# Patient Record
Sex: Female | Born: 1945 | Race: White | Hispanic: No | Smoking: Never smoker
Health system: Southern US, Community
[De-identification: ages and names within clinical notes are randomized; demographics above are authoritative.]

## PROBLEM LIST (undated history)

## (undated) DIAGNOSIS — F419 Anxiety disorder, unspecified: Secondary | ICD-10-CM

## (undated) DIAGNOSIS — R531 Weakness: Secondary | ICD-10-CM

## (undated) DIAGNOSIS — A770 Spotted fever due to Rickettsia rickettsii: Secondary | ICD-10-CM

## (undated) DIAGNOSIS — I509 Heart failure, unspecified: Secondary | ICD-10-CM

## (undated) DIAGNOSIS — I499 Cardiac arrhythmia, unspecified: Secondary | ICD-10-CM

## (undated) DIAGNOSIS — E559 Vitamin D deficiency, unspecified: Secondary | ICD-10-CM

## (undated) DIAGNOSIS — C4442 Squamous cell carcinoma of skin of scalp and neck: Secondary | ICD-10-CM

## (undated) HISTORY — DX: Anxiety disorder, unspecified: F41.9

## (undated) HISTORY — PX: ABDOMINAL HYSTERECTOMY: SHX81

## (undated) HISTORY — DX: Spotted fever due to Rickettsia rickettsii: A77.0

## (undated) HISTORY — DX: Weakness: R53.1

## (undated) HISTORY — DX: Squamous cell carcinoma of skin of scalp and neck: C44.42

## (undated) HISTORY — DX: Vitamin D deficiency, unspecified: E55.9

---

## 2016-04-03 DIAGNOSIS — Z0000 Encounter for general adult medical examination without abnormal findings: Secondary | ICD-10-CM | POA: Diagnosis not present

## 2016-04-03 DIAGNOSIS — Z6828 Body mass index (BMI) 28.0-28.9, adult: Secondary | ICD-10-CM | POA: Diagnosis not present

## 2016-08-13 DIAGNOSIS — Z23 Encounter for immunization: Secondary | ICD-10-CM | POA: Diagnosis not present

## 2016-10-02 DIAGNOSIS — F411 Generalized anxiety disorder: Secondary | ICD-10-CM | POA: Diagnosis not present

## 2016-10-02 DIAGNOSIS — J060 Acute laryngopharyngitis: Secondary | ICD-10-CM | POA: Diagnosis not present

## 2016-10-02 DIAGNOSIS — I10 Essential (primary) hypertension: Secondary | ICD-10-CM | POA: Diagnosis not present

## 2017-04-02 DIAGNOSIS — N6322 Unspecified lump in the left breast, upper inner quadrant: Secondary | ICD-10-CM | POA: Diagnosis not present

## 2017-04-02 DIAGNOSIS — Z0001 Encounter for general adult medical examination with abnormal findings: Secondary | ICD-10-CM | POA: Diagnosis not present

## 2017-04-02 DIAGNOSIS — Z6826 Body mass index (BMI) 26.0-26.9, adult: Secondary | ICD-10-CM | POA: Diagnosis not present

## 2017-04-02 DIAGNOSIS — R5383 Other fatigue: Secondary | ICD-10-CM | POA: Diagnosis not present

## 2017-04-02 DIAGNOSIS — I10 Essential (primary) hypertension: Secondary | ICD-10-CM | POA: Diagnosis not present

## 2017-04-02 DIAGNOSIS — E782 Mixed hyperlipidemia: Secondary | ICD-10-CM | POA: Diagnosis not present

## 2017-04-16 DIAGNOSIS — N6320 Unspecified lump in the left breast, unspecified quadrant: Secondary | ICD-10-CM | POA: Diagnosis not present

## 2017-04-16 DIAGNOSIS — N641 Fat necrosis of breast: Secondary | ICD-10-CM | POA: Diagnosis not present

## 2017-04-16 DIAGNOSIS — R922 Inconclusive mammogram: Secondary | ICD-10-CM | POA: Diagnosis not present

## 2017-04-16 DIAGNOSIS — N6489 Other specified disorders of breast: Secondary | ICD-10-CM | POA: Diagnosis not present

## 2017-07-14 DIAGNOSIS — Z23 Encounter for immunization: Secondary | ICD-10-CM | POA: Diagnosis not present

## 2017-09-15 DIAGNOSIS — J060 Acute laryngopharyngitis: Secondary | ICD-10-CM | POA: Diagnosis not present

## 2017-10-02 DIAGNOSIS — R600 Localized edema: Secondary | ICD-10-CM | POA: Diagnosis not present

## 2017-10-02 DIAGNOSIS — I10 Essential (primary) hypertension: Secondary | ICD-10-CM | POA: Diagnosis not present

## 2017-10-02 DIAGNOSIS — E559 Vitamin D deficiency, unspecified: Secondary | ICD-10-CM | POA: Diagnosis not present

## 2017-10-02 DIAGNOSIS — E782 Mixed hyperlipidemia: Secondary | ICD-10-CM | POA: Diagnosis not present

## 2017-10-02 DIAGNOSIS — F411 Generalized anxiety disorder: Secondary | ICD-10-CM | POA: Diagnosis not present

## 2017-10-12 DIAGNOSIS — J302 Other seasonal allergic rhinitis: Secondary | ICD-10-CM | POA: Diagnosis not present

## 2017-10-12 DIAGNOSIS — J209 Acute bronchitis, unspecified: Secondary | ICD-10-CM | POA: Diagnosis not present

## 2017-10-12 DIAGNOSIS — J0100 Acute maxillary sinusitis, unspecified: Secondary | ICD-10-CM | POA: Diagnosis not present

## 2017-12-02 DIAGNOSIS — J060 Acute laryngopharyngitis: Secondary | ICD-10-CM | POA: Diagnosis not present

## 2017-12-31 DIAGNOSIS — M65879 Other synovitis and tenosynovitis, unspecified ankle and foot: Secondary | ICD-10-CM | POA: Diagnosis not present

## 2017-12-31 DIAGNOSIS — M79671 Pain in right foot: Secondary | ICD-10-CM | POA: Diagnosis not present

## 2018-02-19 DIAGNOSIS — L811 Chloasma: Secondary | ICD-10-CM | POA: Diagnosis not present

## 2018-02-19 DIAGNOSIS — L814 Other melanin hyperpigmentation: Secondary | ICD-10-CM | POA: Diagnosis not present

## 2018-02-19 DIAGNOSIS — L570 Actinic keratosis: Secondary | ICD-10-CM | POA: Diagnosis not present

## 2018-02-19 DIAGNOSIS — L820 Inflamed seborrheic keratosis: Secondary | ICD-10-CM | POA: Diagnosis not present

## 2018-02-19 DIAGNOSIS — D225 Melanocytic nevi of trunk: Secondary | ICD-10-CM | POA: Diagnosis not present

## 2018-04-07 DIAGNOSIS — Z0000 Encounter for general adult medical examination without abnormal findings: Secondary | ICD-10-CM | POA: Diagnosis not present

## 2018-04-07 DIAGNOSIS — R5383 Other fatigue: Secondary | ICD-10-CM | POA: Diagnosis not present

## 2018-04-07 DIAGNOSIS — E559 Vitamin D deficiency, unspecified: Secondary | ICD-10-CM | POA: Diagnosis not present

## 2018-04-07 DIAGNOSIS — Z1231 Encounter for screening mammogram for malignant neoplasm of breast: Secondary | ICD-10-CM | POA: Diagnosis not present

## 2018-04-07 DIAGNOSIS — Z6823 Body mass index (BMI) 23.0-23.9, adult: Secondary | ICD-10-CM | POA: Diagnosis not present

## 2018-04-07 DIAGNOSIS — E782 Mixed hyperlipidemia: Secondary | ICD-10-CM | POA: Diagnosis not present

## 2018-04-07 DIAGNOSIS — Z13820 Encounter for screening for osteoporosis: Secondary | ICD-10-CM | POA: Diagnosis not present

## 2018-05-13 DIAGNOSIS — M8589 Other specified disorders of bone density and structure, multiple sites: Secondary | ICD-10-CM | POA: Diagnosis not present

## 2018-05-13 DIAGNOSIS — Z1231 Encounter for screening mammogram for malignant neoplasm of breast: Secondary | ICD-10-CM | POA: Diagnosis not present

## 2018-05-13 DIAGNOSIS — N959 Unspecified menopausal and perimenopausal disorder: Secondary | ICD-10-CM | POA: Diagnosis not present

## 2018-06-11 DIAGNOSIS — H40013 Open angle with borderline findings, low risk, bilateral: Secondary | ICD-10-CM | POA: Diagnosis not present

## 2018-06-24 DIAGNOSIS — J209 Acute bronchitis, unspecified: Secondary | ICD-10-CM | POA: Diagnosis not present

## 2018-06-24 DIAGNOSIS — J0100 Acute maxillary sinusitis, unspecified: Secondary | ICD-10-CM | POA: Diagnosis not present

## 2018-07-27 DIAGNOSIS — Z23 Encounter for immunization: Secondary | ICD-10-CM | POA: Diagnosis not present

## 2018-08-07 DIAGNOSIS — R05 Cough: Secondary | ICD-10-CM | POA: Diagnosis not present

## 2018-08-07 DIAGNOSIS — S300XXA Contusion of lower back and pelvis, initial encounter: Secondary | ICD-10-CM | POA: Diagnosis not present

## 2018-08-07 DIAGNOSIS — J069 Acute upper respiratory infection, unspecified: Secondary | ICD-10-CM | POA: Diagnosis not present

## 2018-08-11 DIAGNOSIS — H40013 Open angle with borderline findings, low risk, bilateral: Secondary | ICD-10-CM | POA: Diagnosis not present

## 2018-08-11 DIAGNOSIS — H40053 Ocular hypertension, bilateral: Secondary | ICD-10-CM | POA: Diagnosis not present

## 2018-08-24 DIAGNOSIS — J060 Acute laryngopharyngitis: Secondary | ICD-10-CM | POA: Diagnosis not present

## 2018-08-24 DIAGNOSIS — R5383 Other fatigue: Secondary | ICD-10-CM | POA: Diagnosis not present

## 2018-11-12 DIAGNOSIS — F411 Generalized anxiety disorder: Secondary | ICD-10-CM | POA: Diagnosis not present

## 2018-11-12 DIAGNOSIS — J060 Acute laryngopharyngitis: Secondary | ICD-10-CM | POA: Diagnosis not present

## 2018-11-12 DIAGNOSIS — E782 Mixed hyperlipidemia: Secondary | ICD-10-CM | POA: Diagnosis not present

## 2018-11-12 DIAGNOSIS — E559 Vitamin D deficiency, unspecified: Secondary | ICD-10-CM | POA: Diagnosis not present

## 2018-11-12 DIAGNOSIS — I10 Essential (primary) hypertension: Secondary | ICD-10-CM | POA: Diagnosis not present

## 2019-03-10 DIAGNOSIS — M7551 Bursitis of right shoulder: Secondary | ICD-10-CM | POA: Diagnosis not present

## 2019-03-16 DIAGNOSIS — R531 Weakness: Secondary | ICD-10-CM | POA: Diagnosis not present

## 2019-03-16 DIAGNOSIS — R5383 Other fatigue: Secondary | ICD-10-CM | POA: Diagnosis not present

## 2019-03-16 DIAGNOSIS — M546 Pain in thoracic spine: Secondary | ICD-10-CM | POA: Diagnosis not present

## 2019-04-20 DIAGNOSIS — K5901 Slow transit constipation: Secondary | ICD-10-CM | POA: Diagnosis not present

## 2019-04-20 DIAGNOSIS — R1084 Generalized abdominal pain: Secondary | ICD-10-CM | POA: Diagnosis not present

## 2019-04-20 DIAGNOSIS — K5641 Fecal impaction: Secondary | ICD-10-CM | POA: Diagnosis not present

## 2019-04-20 DIAGNOSIS — K5900 Constipation, unspecified: Secondary | ICD-10-CM | POA: Diagnosis not present

## 2019-05-12 DIAGNOSIS — M545 Low back pain: Secondary | ICD-10-CM | POA: Diagnosis not present

## 2019-05-12 DIAGNOSIS — M9903 Segmental and somatic dysfunction of lumbar region: Secondary | ICD-10-CM | POA: Diagnosis not present

## 2019-05-12 DIAGNOSIS — M9902 Segmental and somatic dysfunction of thoracic region: Secondary | ICD-10-CM | POA: Diagnosis not present

## 2019-05-12 DIAGNOSIS — M9905 Segmental and somatic dysfunction of pelvic region: Secondary | ICD-10-CM | POA: Diagnosis not present

## 2019-05-13 DIAGNOSIS — Z1231 Encounter for screening mammogram for malignant neoplasm of breast: Secondary | ICD-10-CM | POA: Diagnosis not present

## 2019-05-13 DIAGNOSIS — Z23 Encounter for immunization: Secondary | ICD-10-CM | POA: Diagnosis not present

## 2019-05-13 DIAGNOSIS — Z0000 Encounter for general adult medical examination without abnormal findings: Secondary | ICD-10-CM | POA: Diagnosis not present

## 2019-05-13 DIAGNOSIS — Z6823 Body mass index (BMI) 23.0-23.9, adult: Secondary | ICD-10-CM | POA: Diagnosis not present

## 2019-05-17 DIAGNOSIS — M9903 Segmental and somatic dysfunction of lumbar region: Secondary | ICD-10-CM | POA: Diagnosis not present

## 2019-05-17 DIAGNOSIS — M9905 Segmental and somatic dysfunction of pelvic region: Secondary | ICD-10-CM | POA: Diagnosis not present

## 2019-05-17 DIAGNOSIS — M9902 Segmental and somatic dysfunction of thoracic region: Secondary | ICD-10-CM | POA: Diagnosis not present

## 2019-05-17 DIAGNOSIS — M545 Low back pain: Secondary | ICD-10-CM | POA: Diagnosis not present

## 2019-05-19 DIAGNOSIS — M9902 Segmental and somatic dysfunction of thoracic region: Secondary | ICD-10-CM | POA: Diagnosis not present

## 2019-05-19 DIAGNOSIS — M9903 Segmental and somatic dysfunction of lumbar region: Secondary | ICD-10-CM | POA: Diagnosis not present

## 2019-05-19 DIAGNOSIS — M545 Low back pain: Secondary | ICD-10-CM | POA: Diagnosis not present

## 2019-05-19 DIAGNOSIS — M9905 Segmental and somatic dysfunction of pelvic region: Secondary | ICD-10-CM | POA: Diagnosis not present

## 2019-05-24 DIAGNOSIS — M545 Low back pain: Secondary | ICD-10-CM | POA: Diagnosis not present

## 2019-05-24 DIAGNOSIS — M9903 Segmental and somatic dysfunction of lumbar region: Secondary | ICD-10-CM | POA: Diagnosis not present

## 2019-05-24 DIAGNOSIS — M9905 Segmental and somatic dysfunction of pelvic region: Secondary | ICD-10-CM | POA: Diagnosis not present

## 2019-05-24 DIAGNOSIS — M9902 Segmental and somatic dysfunction of thoracic region: Secondary | ICD-10-CM | POA: Diagnosis not present

## 2019-05-31 DIAGNOSIS — M9905 Segmental and somatic dysfunction of pelvic region: Secondary | ICD-10-CM | POA: Diagnosis not present

## 2019-05-31 DIAGNOSIS — M9902 Segmental and somatic dysfunction of thoracic region: Secondary | ICD-10-CM | POA: Diagnosis not present

## 2019-05-31 DIAGNOSIS — M545 Low back pain: Secondary | ICD-10-CM | POA: Diagnosis not present

## 2019-05-31 DIAGNOSIS — M9903 Segmental and somatic dysfunction of lumbar region: Secondary | ICD-10-CM | POA: Diagnosis not present

## 2019-06-04 DIAGNOSIS — M545 Low back pain: Secondary | ICD-10-CM | POA: Diagnosis not present

## 2019-06-04 DIAGNOSIS — M9905 Segmental and somatic dysfunction of pelvic region: Secondary | ICD-10-CM | POA: Diagnosis not present

## 2019-06-04 DIAGNOSIS — M9902 Segmental and somatic dysfunction of thoracic region: Secondary | ICD-10-CM | POA: Diagnosis not present

## 2019-06-04 DIAGNOSIS — M9903 Segmental and somatic dysfunction of lumbar region: Secondary | ICD-10-CM | POA: Diagnosis not present

## 2019-06-10 DIAGNOSIS — K5904 Chronic idiopathic constipation: Secondary | ICD-10-CM | POA: Diagnosis not present

## 2019-06-10 DIAGNOSIS — K649 Unspecified hemorrhoids: Secondary | ICD-10-CM | POA: Diagnosis not present

## 2019-07-01 DIAGNOSIS — R5383 Other fatigue: Secondary | ICD-10-CM | POA: Diagnosis not present

## 2019-07-01 DIAGNOSIS — E559 Vitamin D deficiency, unspecified: Secondary | ICD-10-CM | POA: Diagnosis not present

## 2019-07-07 ENCOUNTER — Encounter: Payer: Self-pay | Admitting: Physician Assistant

## 2019-07-07 DIAGNOSIS — R109 Unspecified abdominal pain: Secondary | ICD-10-CM | POA: Diagnosis not present

## 2019-07-07 DIAGNOSIS — I700 Atherosclerosis of aorta: Secondary | ICD-10-CM | POA: Diagnosis not present

## 2019-07-07 DIAGNOSIS — R10817 Generalized abdominal tenderness: Secondary | ICD-10-CM | POA: Diagnosis not present

## 2019-07-07 DIAGNOSIS — K5730 Diverticulosis of large intestine without perforation or abscess without bleeding: Secondary | ICD-10-CM | POA: Diagnosis not present

## 2019-07-08 DIAGNOSIS — K649 Unspecified hemorrhoids: Secondary | ICD-10-CM | POA: Diagnosis not present

## 2019-07-08 DIAGNOSIS — K5904 Chronic idiopathic constipation: Secondary | ICD-10-CM | POA: Diagnosis not present

## 2019-07-08 DIAGNOSIS — K624 Stenosis of anus and rectum: Secondary | ICD-10-CM | POA: Diagnosis not present

## 2019-07-21 DIAGNOSIS — Z23 Encounter for immunization: Secondary | ICD-10-CM | POA: Diagnosis not present

## 2019-07-21 DIAGNOSIS — J060 Acute laryngopharyngitis: Secondary | ICD-10-CM | POA: Diagnosis not present

## 2019-07-21 DIAGNOSIS — A770 Spotted fever due to Rickettsia rickettsii: Secondary | ICD-10-CM | POA: Diagnosis not present

## 2019-07-21 DIAGNOSIS — R10817 Generalized abdominal tenderness: Secondary | ICD-10-CM | POA: Diagnosis not present

## 2019-07-29 DIAGNOSIS — Z1231 Encounter for screening mammogram for malignant neoplasm of breast: Secondary | ICD-10-CM | POA: Diagnosis not present

## 2019-08-18 DIAGNOSIS — F411 Generalized anxiety disorder: Secondary | ICD-10-CM | POA: Diagnosis not present

## 2019-08-18 DIAGNOSIS — E559 Vitamin D deficiency, unspecified: Secondary | ICD-10-CM | POA: Diagnosis not present

## 2019-08-18 DIAGNOSIS — R5383 Other fatigue: Secondary | ICD-10-CM | POA: Diagnosis not present

## 2019-08-18 DIAGNOSIS — I10 Essential (primary) hypertension: Secondary | ICD-10-CM | POA: Diagnosis not present

## 2019-08-18 DIAGNOSIS — A770 Spotted fever due to Rickettsia rickettsii: Secondary | ICD-10-CM | POA: Diagnosis not present

## 2019-09-13 DIAGNOSIS — M9905 Segmental and somatic dysfunction of pelvic region: Secondary | ICD-10-CM | POA: Diagnosis not present

## 2019-09-13 DIAGNOSIS — M545 Low back pain: Secondary | ICD-10-CM | POA: Diagnosis not present

## 2019-09-13 DIAGNOSIS — M9903 Segmental and somatic dysfunction of lumbar region: Secondary | ICD-10-CM | POA: Diagnosis not present

## 2019-09-13 DIAGNOSIS — M9902 Segmental and somatic dysfunction of thoracic region: Secondary | ICD-10-CM | POA: Diagnosis not present

## 2019-09-28 DIAGNOSIS — L821 Other seborrheic keratosis: Secondary | ICD-10-CM | POA: Diagnosis not present

## 2019-09-28 DIAGNOSIS — C4442 Squamous cell carcinoma of skin of scalp and neck: Secondary | ICD-10-CM | POA: Diagnosis not present

## 2019-09-28 DIAGNOSIS — C44329 Squamous cell carcinoma of skin of other parts of face: Secondary | ICD-10-CM | POA: Diagnosis not present

## 2019-11-04 DIAGNOSIS — C4442 Squamous cell carcinoma of skin of scalp and neck: Secondary | ICD-10-CM | POA: Diagnosis not present

## 2019-11-24 ENCOUNTER — Encounter: Payer: Self-pay | Admitting: Physician Assistant

## 2019-11-24 ENCOUNTER — Other Ambulatory Visit: Payer: Self-pay

## 2019-11-24 ENCOUNTER — Ambulatory Visit (INDEPENDENT_AMBULATORY_CARE_PROVIDER_SITE_OTHER): Payer: Medicare HMO | Admitting: Physician Assistant

## 2019-11-24 VITALS — BP 142/80 | HR 81 | Temp 98.7°F | Resp 16 | Ht 63.25 in | Wt 127.0 lb

## 2019-11-24 DIAGNOSIS — E559 Vitamin D deficiency, unspecified: Secondary | ICD-10-CM | POA: Insufficient documentation

## 2019-11-24 DIAGNOSIS — A770 Spotted fever due to Rickettsia rickettsii: Secondary | ICD-10-CM | POA: Diagnosis not present

## 2019-11-24 DIAGNOSIS — E782 Mixed hyperlipidemia: Secondary | ICD-10-CM | POA: Diagnosis not present

## 2019-11-24 DIAGNOSIS — R531 Weakness: Secondary | ICD-10-CM

## 2019-11-24 DIAGNOSIS — F419 Anxiety disorder, unspecified: Secondary | ICD-10-CM | POA: Insufficient documentation

## 2019-11-24 DIAGNOSIS — R69 Illness, unspecified: Secondary | ICD-10-CM | POA: Diagnosis not present

## 2019-11-24 NOTE — Assessment & Plan Note (Signed)
Continue current meds as directed 

## 2019-11-24 NOTE — Assessment & Plan Note (Signed)
labwork pending - cbc, cmp, tsh

## 2019-11-24 NOTE — Assessment & Plan Note (Signed)
Continue otc supplement Vit D level pending

## 2019-11-24 NOTE — Progress Notes (Signed)
Established Patient Office Visit  Subjective:  Patient ID: Mary Carson, female    DOB: [birth year]  Age: 74 y.o. MRN: [id number]  CC:  Chief Complaint  Patient presents with  . Anxiety    HPI MARLYCE MCDOUGALD presents for Mixed hyperlipidemia  Pt presents with hyperlipidemia. . Compliance with treatment has been good; The patient is , maintains a low cholesterol diet , follows up as directed , and maintains an exercise regimen . The patient denies experiencing any hypercholesterolemia related symptoms.   Pt here for follow up of anxiety - is doing well on citalopram and ativan at this time - voices no concerns or problems  Pt due to check vitamin d level - is taking otc supplement at this time  Past Medical History:  Diagnosis Date  . Anxiety   . RMSF [facility] spotted fever)   . Squamous cell carcinoma of scalp   . Vitamin D deficiency   . Weakness     Past Surgical History:  Procedure Laterality Date  . ABDOMINAL HYSTERECTOMY      Family History  Problem Relation Age of Onset  . CAD Other     Social History   Socioeconomic History  . Marital status: Widowed    Spouse name: Not on file  . Number of children: 1  . Years of education: Not on file  . Highest education level: Not on file  Occupational History  . Occupation: retired  Tobacco Use  . Smoking status: Never Smoker  . Smokeless tobacco: Never Used  Substance and Sexual Activity  . Alcohol use: Never  . Drug use: Never  . Sexual activity: Not on file  Other Topics Concern  . Not on file  Social History Narrative  . Not on file   Social Determinants of Health   Financial Resource Strain:   . Difficulty of Paying Living Expenses: Not on file  Food Insecurity:   . Worried About Charity fundraiser in the Last Year: Not on file  . Ran Out of Food in the Last Year: Not on file  Transportation Needs:   . Lack of Transportation (Medical): Not on file  . Lack of Transportation  (Non-Medical): Not on file  Physical Activity:   . Days of Exercise per Week: Not on file  . Minutes of Exercise per Session: Not on file  Stress:   . Feeling of Stress : Not on file  Social Connections:   . Frequency of Communication with Friends and Family: Not on file  . Frequency of Social Gatherings with Friends and Family: Not on file  . Attends Religious Services: Not on file  . Active Member of Clubs or Organizations: Not on file  . Attends Archivist Meetings: Not on file  . Marital Status: Not on file  Intimate Partner Violence:   . Fear of Current or Ex-Partner: Not on file  . Emotionally Abused: Not on file  . Physically Abused: Not on file  . Sexually Abused: Not on file    Outpatient Medications Prior to Visit  Medication Sig Dispense Refill  . aspirin EC 81 MG tablet Take 81 mg by mouth daily.    . citalopram (CELEXA) 20 MG tablet Take 20 mg by mouth daily.    Marland Kitchen LORazepam (ATIVAN) 1 MG tablet Take 1 mg by mouth at bedtime.    . polyethylene glycol (MIRALAX / GLYCOLAX) 17 g packet Take 17 g by mouth 2 (two) times daily.    Marland Kitchen  Potassium (POTASSIMIN PO) Take by mouth.    Marland Kitchen VITAMIN E NATURAL PO Take by mouth.    . hydrochlorothiazide (HYDRODIURIL) 25 MG tablet Take 25 mg by mouth daily.     No facility-administered medications prior to visit.    Allergies  Allergen Reactions  . Augmentin [Amoxicillin-Pot Clavulanate]   . Penicillins     ROS Review of Systems CONSTITUTIONAL: Negative for chills, fatigue, fever, unintentional weight gain and unintentional weight loss.  E/N/T: Negative for ear pain, nasal congestion and sore throat.  CARDIOVASCULAR: Negative for chest pain, dizziness, palpitations and pedal edema.  RESPIRATORY: Negative for recent cough and dyspnea.  GASTROINTESTINAL: Negative for abdominal pain, acid reflux symptoms, constipation, diarrhea, nausea and vomiting.  MSK: Negative for arthralgias and myalgias.  INTEGUMENTARY: Negative for  rash.  NEUROLOGICAL: Negative for dizziness and headaches.  PSYCHIATRIC: Negative for sleep disturbance and to question depression screen.  Negative for depression, negative for anhedonia.        Objective:    Physical Exam  BP (!) 142/80   Pulse 81   Temp 98.7 F (37.1 C)   Resp 16   Ht 5' 3.25" (1.607 m)   Wt 127 lb (57.6 kg)   SpO2 99%   BMI 22.32 kg/m  Wt Readings from Last 3 Encounters:  11/24/19 127 lb (57.6 kg)   PHYSICAL EXAM:   VS: BP (!) 142/80   Pulse 81   Temp 98.7 F (37.1 C)   Resp 16   Ht 5' 3.25" (1.607 m)   Wt 127 lb (57.6 kg)   SpO2 99%   BMI 22.32 kg/m   GEN: Well nourished, well developed, in no acute distress  HEENT: normal external ears and nose - normal external auditory canals and TMS - hearing grossly normal - normal nasal mucosa and septum - Lips, Teeth and Gums - normal  Oropharynx - normal mucosa, palate, and posterior pharynx Neck: no JVD or masses - no thyromegaly Cardiac: RRR; no murmurs, rubs, or gallops,no edema - no significant varicosities Respiratory:  normal respiratory rate and pattern with no distress - normal breath sounds with no rales, rhonchi, wheezes or rubs Skin: warm and dry, no rash  Neuro:  Alert and Oriented x 3, Strength and sensation are intact - CN II-Xii grossly intact Psych: euthymic mood, appropriate affect and demeanor   Health Maintenance Due  Topic Date Due  . Hepatitis C Screening  [birth year]  . TETANUS/TDAP  09/09/1964  . MAMMOGRAM  09/10/1995  . COLONOSCOPY  09/10/1995  . DEXA SCAN  09/09/2010  . PNA vac Low Risk Adult (1 of 2 - PCV13) 09/09/2010  . INFLUENZA VACCINE  05/22/2019    There are no preventive care reminders to display for this patient.  No results found for: TSH No results found for: WBC, HGB, HCT, MCV, PLT No results found for: NA, K, CHLORIDE, CO2, GLUCOSE, BUN, CREATININE, BILITOT, ALKPHOS, AST, ALT, PROT, ALBUMIN, CALCIUM, ANIONGAP, EGFR, GFR No results found for: CHOL No  results found for: HDL No results found for: LDLCALC No results found for: TRIG No results found for: CHOLHDL No results found for: HGBA1C    Assessment & Plan:   Problem List Items Addressed This Visit      Other   Vitamin D deficiency    Continue otc supplement Vit D level pending      Relevant Orders   VITAMIN D 25 Hydroxy (Vit-D Deficiency, Fractures)   Anxiety    Continue current meds as directed  Relevant Medications   citalopram (CELEXA) 20 MG tablet   LORazepam (ATIVAN) 1 MG tablet   Weakness    labwork pending - cbc, cmp, tsh      Relevant Orders   CBC with Differential/Platelet   Comprehensive metabolic panel   TSH   [facility] spotted fever    Other Visit Diagnoses    Mixed hyperlipidemia    -  Primary   Relevant Medications   aspirin EC 81 MG tablet   Other Relevant Orders   Lipid panel      No orders of the defined types were placed in this encounter.   Follow-up: Return in about 6 months (around 05/23/2020).    SARA R Noretta Frier, PA-C

## 2019-11-24 NOTE — Patient Instructions (Signed)
-

## 2019-11-25 ENCOUNTER — Telehealth: Payer: Self-pay

## 2019-11-25 LAB — TSH: TSH: 1.93 u[IU]/mL (ref 0.450–4.500)

## 2019-11-25 LAB — LIPID PANEL
Chol/HDL Ratio: 2.4 ratio (ref 0.0–4.4)
Cholesterol, Total: 205 mg/dL — ABNORMAL HIGH (ref 100–199)
HDL: 87 mg/dL (ref >39)
LDL Chol Calc (NIH): 102 mg/dL — ABNORMAL HIGH (ref 0–99)
Triglycerides: 90 mg/dL (ref 0–149)
VLDL Cholesterol Cal: 16 mg/dL (ref 5–40)

## 2019-11-25 LAB — COMPREHENSIVE METABOLIC PANEL
ALT: 14 IU/L (ref 0–32)
AST: 20 IU/L (ref 0–40)
Albumin/Globulin Ratio: 2.5 — ABNORMAL HIGH (ref 1.2–2.2)
Albumin: 4.5 g/dL (ref 3.7–4.7)
Alkaline Phosphatase: 62 IU/L (ref 39–117)
BUN/Creatinine Ratio: 17 (ref 12–28)
BUN: 10 mg/dL (ref 8–27)
Bilirubin Total: 0.5 mg/dL (ref 0.0–1.2)
CO2: 25 mmol/L (ref 20–29)
Calcium: 9.4 mg/dL (ref 8.7–10.3)
Chloride: 99 mmol/L (ref 96–106)
Creatinine, Ser: 0.59 mg/dL (ref 0.57–1.00)
GFR calc Af Amer: 104 mL/min/{1.73_m2} (ref >59)
GFR calc non Af Amer: 91 mL/min/{1.73_m2} (ref >59)
Globulin, Total: 1.8 g/dL (ref 1.5–4.5)
Glucose: 92 mg/dL (ref 65–99)
Potassium: 4.9 mmol/L (ref 3.5–5.2)
Sodium: 137 mmol/L (ref 134–144)
Total Protein: 6.3 g/dL (ref 6.0–8.5)

## 2019-11-25 LAB — CBC WITH DIFFERENTIAL/PLATELET
Basophils Absolute: 0.1 10*3/uL (ref 0.0–0.2)
Basos: 2 %
EOS (ABSOLUTE): 0.2 10*3/uL (ref 0.0–0.4)
Eos: 3 %
Hematocrit: 39.1 % (ref 34.0–46.6)
Hemoglobin: 12.8 g/dL (ref 11.1–15.9)
Immature Grans (Abs): 0 10*3/uL (ref 0.0–0.1)
Immature Granulocytes: 0 %
Lymphocytes Absolute: 1.8 10*3/uL (ref 0.7–3.1)
Lymphs: 38 %
MCH: 30.5 pg (ref 26.6–33.0)
MCHC: 32.7 g/dL (ref 31.5–35.7)
MCV: 93 fL (ref 79–97)
Monocytes Absolute: 0.5 10*3/uL (ref 0.1–0.9)
Monocytes: 10 %
Neutrophils Absolute: 2.3 10*3/uL (ref 1.4–7.0)
Neutrophils: 47 %
Platelets: 236 10*3/uL (ref 150–450)
RBC: 4.19 x10E6/uL (ref 3.77–5.28)
RDW: 12.4 % (ref 11.7–15.4)
WBC: 4.9 10*3/uL (ref 3.4–10.8)

## 2019-11-25 LAB — VITAMIN D 25 HYDROXY (VIT D DEFICIENCY, FRACTURES): Vit D, 25-Hydroxy: 48.3 ng/mL (ref 30.0–100.0)

## 2019-11-25 LAB — CARDIOVASCULAR RISK ASSESSMENT

## 2019-11-25 NOTE — Telephone Encounter (Signed)
Patient was notified.

## 2019-12-02 DIAGNOSIS — R69 Illness, unspecified: Secondary | ICD-10-CM | POA: Diagnosis not present

## 2019-12-15 ENCOUNTER — Other Ambulatory Visit: Payer: Self-pay | Admitting: Physician Assistant

## 2019-12-20 ENCOUNTER — Other Ambulatory Visit: Payer: Self-pay | Admitting: Physician Assistant

## 2019-12-23 ENCOUNTER — Other Ambulatory Visit: Payer: Self-pay | Admitting: Physician Assistant

## 2019-12-23 MED ORDER — CITALOPRAM HYDROBROMIDE 20 MG PO TABS: 20.0000 mg | ORAL_TABLET | Freq: Every day | ORAL | 0 refills | Status: DC

## 2020-01-04 ENCOUNTER — Other Ambulatory Visit: Payer: Self-pay

## 2020-01-04 ENCOUNTER — Ambulatory Visit (INDEPENDENT_AMBULATORY_CARE_PROVIDER_SITE_OTHER): Payer: Medicare HMO | Admitting: Physician Assistant

## 2020-01-04 ENCOUNTER — Encounter: Payer: Self-pay | Admitting: Physician Assistant

## 2020-01-04 VITALS — BP 148/80 | HR 85 | Temp 96.9°F | Resp 16 | Wt 128.0 lb

## 2020-01-04 DIAGNOSIS — R5383 Other fatigue: Secondary | ICD-10-CM | POA: Insufficient documentation

## 2020-01-04 MED ORDER — DOXYCYCLINE HYCLATE 100 MG PO TABS: 100.0000 mg | ORAL_TABLET | Freq: Two times a day (BID) | ORAL | 0 refills | Status: DC

## 2020-01-04 NOTE — Progress Notes (Signed)
Acute Office Visit  Subjective:    Patient ID: Mary Carson, female    DOB: May 09, 1946, 74 y.o.   MRN: [id number]  Chief Complaint  Patient presents with  . Fatigue    HPI Patient is in today for fatigue Pt states for the past few weeks she has felt tired, fatigued and weak.  Has had an intermittent headache .  She states the same as she felt last year when she had RMSF.  She actually found a small tick on her lower leg last week.  She denies fever or rash.  Denies cough, congestion, abdominal pain or trouble with bowels or urine  Past Medical History:  Diagnosis Date  . Anxiety   . RMSF [facility] spotted fever)   . Squamous cell carcinoma of scalp   . Vitamin D deficiency   . Weakness     Past Surgical History:  Procedure Laterality Date  . ABDOMINAL HYSTERECTOMY      Family History  Problem Relation Age of Onset  . CAD Other     Social History   Socioeconomic History  . Marital status: Widowed    Spouse name: Not on file  . Number of children: 1  . Years of education: Not on file  . Highest education level: Not on file  Occupational History  . Occupation: retired  Tobacco Use  . Smoking status: Never Smoker  . Smokeless tobacco: Never Used  Substance and Sexual Activity  . Alcohol use: Never  . Drug use: Never  . Sexual activity: Not on file  Other Topics Concern  . Not on file  Social History Narrative  . Not on file   Social Determinants of Health   Financial Resource Strain:   . Difficulty of Paying Living Expenses:   Food Insecurity:   . Worried About Charity fundraiser in the Last Year:   . Arboriculturist in the Last Year:   Transportation Needs:   . Film/video editor (Medical):   Marland Kitchen Lack of Transportation (Non-Medical):   Physical Activity:   . Days of Exercise per Week:   . Minutes of Exercise per Session:   Stress:   . Feeling of Stress :   Social Connections:   . Frequency of Communication with Friends and  Family:   . Frequency of Social Gatherings with Friends and Family:   . Attends Religious Services:   . Active Member of Clubs or Organizations:   . Attends Archivist Meetings:   Marland Kitchen Marital Status:   Intimate Partner Violence:   . Fear of Current or Ex-Partner:   . Emotionally Abused:   Marland Kitchen Physically Abused:   . Sexually Abused:      Current Outpatient Medications:  .  aspirin EC 81 MG tablet, Take 81 mg by mouth daily., Disp: , Rfl:  .  benzonatate (TESSALON) 200 MG capsule, TAKE 1 CAPSULE BY MOUTH THREE TIMES DAILY FOR COUGH, Disp: 90 capsule, Rfl: 0 .  citalopram (CELEXA) 20 MG tablet, Take 1 tablet (20 mg total) by mouth daily., Disp: 90 tablet, Rfl: 0 .  doxycycline (VIBRA-TABS) 100 MG tablet, Take 1 tablet (100 mg total) by mouth 2 (two) times daily., Disp: 20 tablet, Rfl: 0 .  LORazepam (ATIVAN) 1 MG tablet, Take 1 mg by mouth at bedtime., Disp: , Rfl:  .  polyethylene glycol (MIRALAX / GLYCOLAX) 17 g packet, Take 17 g by mouth 2 (two) times daily., Disp: , Rfl:  .  Potassium (POTASSIMIN PO), Take by mouth., Disp: , Rfl:  .  VITAMIN E NATURAL PO, Take by mouth., Disp: , Rfl:    Allergies  Allergen Reactions  . Augmentin [Amoxicillin-Pot Clavulanate]   . Penicillins     CONSTITUTIONAL:  See HPI E/N/T: Negative for ear pain, nasal congestion and sore throat.  CARDIOVASCULAR: Negative for chest pain, dizziness, palpitations and pedal edema.  RESPIRATORY: Negative for recent cough and dyspnea.  GASTROINTESTINAL: Negative for abdominal pain, acid reflux symptoms, constipation, diarrhea, nausea and vomiting.  MSK: Negative for arthralgias and myalgias.  INTEGUMENTARY: Negative for rash.  NEUROLOGICAL: Negative for dizziness and headaches.  PSYCHIATRIC: Negative for sleep disturbance and to question depression screen.  Negative for depression, negative for anhedonia.         Objective:    PHYSICAL EXAM:   VS: BP (!) 148/80   Pulse 85   Temp (!) 96.9 F (36.1  C)   Resp 16   Wt 128 lb (58.1 kg)   SpO2 99%   BMI 22.50 kg/m   GEN: Well nourished, well developed, in no acute distress   Oropharynx - normal mucosa, palate, and posterior pharynx  Cardiac: RRR; no murmurs, rubs, or gallops,no edema - no significant varicosities Respiratory:  normal respiratory rate and pattern with no distress - normal breath sounds with no rales, rhonchi, wheezes or rubs  MS: no deformity or atrophy  Skin: warm and dry, no rash  Neuro:  Alert and Oriented x 3, Strength and sensation are intact - CN II-Xii grossly intact Psych: euthymic mood, appropriate affect and demeanor   Wt Readings from Last 3 Encounters:  01/04/20 128 lb (58.1 kg)  11/24/19 127 lb (57.6 kg)    Health Maintenance Due  Topic Date Due  . Hepatitis C Screening  Never done  . TETANUS/TDAP  Never done  . MAMMOGRAM  Never done  . COLONOSCOPY  Never done  . DEXA SCAN  Never done  . PNA vac Low Risk Adult (1 of 2 - PCV13) Never done  . INFLUENZA VACCINE  Never done    There are no preventive care reminders to display for this patient.        Assessment & Plan:   Problem List Items Addressed This Visit      Other   Lethargy - Primary    rx for doxycycline Rest, fluids labwork pending      Relevant Orders   Lyme Ab/Western Blot Reflex   [facility] spotted fvr ab, IgM-blood   [place] [facility] spotted fvr ab, IgG-blood   CBC with Differential/Platelet   Comprehensive metabolic panel   TSH       Meds ordered this encounter  Medications  . doxycycline (VIBRA-TABS) 100 MG tablet    Sig: Take 1 tablet (100 mg total) by mouth 2 (two) times daily.    Dispense:  20 tablet    Refill:  0    Order Specific Question:   Supervising Provider    Answer:   Marge Duncans [id number]     [place], PA-C

## 2020-01-04 NOTE — Assessment & Plan Note (Signed)
rx for doxycycline Rest, fluids labwork pending

## 2020-01-06 LAB — LYME AB/WESTERN BLOT REFLEX
LYME DISEASE AB, QUANT, IGM: <0.8 index (ref 0.00–0.79)
Lyme IgG/IgM Ab: <0.91 {ISR} (ref 0.00–0.90)

## 2020-01-06 LAB — ROCKY MTN SPOTTED FVR AB, IGG-BLOOD: RMSF IgG: NEGATIVE

## 2020-01-06 LAB — ROCKY MTN SPOTTED FVR AB, IGM-BLOOD: RMSF IgM: 0.06 index (ref 0.00–0.89)

## 2020-01-10 ENCOUNTER — Other Ambulatory Visit: Payer: Self-pay | Admitting: Physician Assistant

## 2020-01-14 ENCOUNTER — Other Ambulatory Visit: Payer: Self-pay | Admitting: Physician Assistant

## 2020-01-14 MED ORDER — LORAZEPAM 1 MG PO TABS: 1.0000 mg | ORAL_TABLET | Freq: Every day | ORAL | 0 refills | Status: DC

## 2020-01-18 ENCOUNTER — Ambulatory Visit (INDEPENDENT_AMBULATORY_CARE_PROVIDER_SITE_OTHER): Payer: Medicare HMO | Admitting: Physician Assistant

## 2020-01-18 ENCOUNTER — Other Ambulatory Visit: Payer: Self-pay

## 2020-01-18 ENCOUNTER — Encounter: Payer: Self-pay | Admitting: Physician Assistant

## 2020-01-18 VITALS — BP 142/80 | HR 75 | Temp 96.3°F | Resp 16 | Wt 125.0 lb

## 2020-01-18 DIAGNOSIS — K2970 Gastritis, unspecified, without bleeding: Secondary | ICD-10-CM | POA: Insufficient documentation

## 2020-01-18 DIAGNOSIS — R531 Weakness: Secondary | ICD-10-CM

## 2020-01-18 DIAGNOSIS — K2900 Acute gastritis without bleeding: Secondary | ICD-10-CM | POA: Diagnosis not present

## 2020-01-18 LAB — POCT URINALYSIS DIPSTICK
Bilirubin, UA: NEGATIVE
Blood, UA: NEGATIVE
Glucose, UA: NEGATIVE
Ketones, UA: NEGATIVE
Leukocytes, UA: NEGATIVE
Nitrite, UA: NEGATIVE
Protein, UA: NEGATIVE
Spec Grav, UA: 1.025 (ref 1.010–1.025)
Urobilinogen, UA: 0.2 E.U./dL
pH, UA: 6 (ref 5.0–8.0)

## 2020-01-18 MED ORDER — BENZONATATE 200 MG PO CAPS: ORAL_CAPSULE | ORAL | 0 refills | Status: DC

## 2020-01-18 MED ORDER — OMEPRAZOLE 40 MG PO CPDR: 40.0000 mg | DELAYED_RELEASE_CAPSULE | Freq: Every day | ORAL | 2 refills | Status: DC

## 2020-01-18 MED ORDER — LORAZEPAM 1 MG PO TABS: 1.0000 mg | ORAL_TABLET | Freq: Every day | ORAL | 1 refills | Status: DC

## 2020-01-18 NOTE — Assessment & Plan Note (Signed)
labwork pending 

## 2020-01-18 NOTE — Assessment & Plan Note (Signed)
Refer back to Dr Melina Copa however they cannot see her until 03/16/20 Will start omeprazole 40mg  qd labwork pending

## 2020-01-18 NOTE — Progress Notes (Signed)
Acute Office Visit  Subjective:    Patient ID: Mary Carson, female    DOB: Apr 21, 1946, 73 y.o.   MRN: [id number]  Chief Complaint  Patient presents with  . Follow-up  . Fatigue    HPI Patient is in today for follow up of fatigue Pt states that overall she is not as fatigued as she was at last visit.  Has finished her doxycycline and her labwork for RMSF and Lyme were negative.    She states now she is having problems with her stomach again like she was having several months ago.  She had seen Dr Melina Copa and evaluated however at the time of the appt she told them her stomach was better and no further evaluation was done on patient.  She states now she is having trouble eating and having a burning pain in her upper stomach to a point she is afraid to eat.  She is having associated nausea and some weight loss as well. She states her symptoms have been intermittent since last seeing Dr Melina Copa but worse in the past several weeks  Past Medical History:  Diagnosis Date  . Anxiety   . RMSF [facility] spotted fever)   . Squamous cell carcinoma of scalp   . Vitamin D deficiency   . Weakness     Past Surgical History:  Procedure Laterality Date  . ABDOMINAL HYSTERECTOMY      Family History  Problem Relation Age of Onset  . CAD Other     Social History   Socioeconomic History  . Marital status: Widowed    Spouse name: Not on file  . Number of children: 1  . Years of education: Not on file  . Highest education level: Not on file  Occupational History  . Occupation: retired  Tobacco Use  . Smoking status: Never Smoker  . Smokeless tobacco: Never Used  Substance and Sexual Activity  . Alcohol use: Never  . Drug use: Never  . Sexual activity: Not on file  Other Topics Concern  . Not on file  Social History Narrative  . Not on file   Social Determinants of Health   Financial Resource Strain:   . Difficulty of Paying Living Expenses:   Food Insecurity:    . Worried About Charity fundraiser in the Last Year:   . Arboriculturist in the Last Year:   Transportation Needs:   . Film/video editor (Medical):   Marland Kitchen Lack of Transportation (Non-Medical):   Physical Activity:   . Days of Exercise per Week:   . Minutes of Exercise per Session:   Stress:   . Feeling of Stress :   Social Connections:   . Frequency of Communication with Friends and Family:   . Frequency of Social Gatherings with Friends and Family:   . Attends Religious Services:   . Active Member of Clubs or Organizations:   . Attends Archivist Meetings:   Marland Kitchen Marital Status:   Intimate Partner Violence:   . Fear of Current or Ex-Partner:   . Emotionally Abused:   Marland Kitchen Physically Abused:   . Sexually Abused:      Current Outpatient Medications:  .  aspirin EC 81 MG tablet, Take 81 mg by mouth daily., Disp: , Rfl:  .  benzonatate (TESSALON) 200 MG capsule, TAKE 1 CAPSULE BY MOUTH THREE TIMES DAILY FOR COUGH, Disp: 90 capsule, Rfl: 0 .  citalopram (CELEXA) 20 MG tablet, Take 1 tablet (20  mg total) by mouth daily., Disp: 90 tablet, Rfl: 0 .  LORazepam (ATIVAN) 1 MG tablet, Take 1 tablet (1 mg total) by mouth at bedtime., Disp: 90 tablet, Rfl: 0 .  Potassium (POTASSIMIN PO), Take by mouth., Disp: , Rfl:  .  VITAMIN E NATURAL PO, Take by mouth., Disp: , Rfl:    Allergies  Allergen Reactions  . Augmentin [Amoxicillin-Pot Clavulanate]   . Penicillins     CONSTITUTIONAL: see HPI E/N/T: Negative for ear pain, nasal congestion and sore throat.  CARDIOVASCULAR: Negative for chest pain, dizziness, palpitations and pedal edema.  RESPIRATORY: Negative for recent cough and dyspnea.  GASTROINTESTINAL: see HPI MSK: Negative for arthralgias and myalgias.  INTEGUMENTARY: Negative for rash.  NEUROLOGICAL pt states she has slight dizziness when she gets nauseated PSYCHIATRIC: Negative for sleep disturbance and to question depression screen.  Negative for depression, negative  for anhedonia.         Objective:    PHYSICAL EXAM:   VS: BP (!) 142/80   Pulse 75   Temp (!) 96.3 F (35.7 C)   Resp 16   Wt 125 lb (56.7 kg)   SpO2 93%   BMI 21.97 kg/m   GEN: Well nourished, well developed, in no acute distress  HEENT: normal external ears and nose - normal external auditory canals and TMS - hearing grossly normal - normal nasal mucosa and septum - Lips, Teeth and Gums - normal  Oropharynx - normal mucosa, palate, and posterior pharynx  Cardiac: RRR; no murmurs, rubs, or gallops,no edema - no significant varicosities Respiratory:  normal respiratory rate and pattern with no distress - normal breath sounds with no rales, rhonchi, wheezes or rubs GI: normal bowel sounds, no masses generalized tenderness noted  Neuro:  Alert and Oriented x 3, Strength and sensation are intact - CN II-Xii grossly intact Psych: euthymic mood, appropriate affect and demeanor  Office Visit on 01/18/2020  Component Date Value Ref Range Status  . Color, UA 01/18/2020 yellow   Final  . Clarity, UA 01/18/2020 clear   Final  . Glucose, UA 01/18/2020 Negative  Negative Final  . Bilirubin, UA 01/18/2020 neg   Final  . Ketones, UA 01/18/2020 neg   Final  . Spec Grav, UA 01/18/2020 1.025  1.010 - 1.025 Final  . Blood, UA 01/18/2020 neg   Final  . pH, UA 01/18/2020 6.0  5.0 - 8.0 Final  . Protein, UA 01/18/2020 Negative  Negative Final  . Urobilinogen, UA 01/18/2020 0.2  0.2 or 1.0 E.U./dL Final  . Nitrite, UA 01/18/2020 neg   Final  . Leukocytes, UA 01/18/2020 Negative  Negative Final  . Appearance 01/18/2020 clear   Final  . Odor 01/18/2020 none   Final     Wt Readings from Last 3 Encounters:  01/18/20 125 lb (56.7 kg)  01/04/20 128 lb (58.1 kg)  11/24/19 127 lb (57.6 kg)    Health Maintenance Due  Topic Date Due  . Hepatitis C Screening  Never done  . TETANUS/TDAP  Never done  . MAMMOGRAM  Never done  . COLONOSCOPY  Never done  . DEXA SCAN  Never done  . PNA vac Low  Risk Adult (1 of 2 - PCV13) Never done  . INFLUENZA VACCINE  Never done    There are no preventive care reminders to display for this patient.        Assessment & Plan:   Problem List Items Addressed This Visit    None  No orders of the defined types were placed in this encounter.    SARA R Vontrell Pullman, PA-C

## 2020-01-19 LAB — TSH: TSH: 1.04 u[IU]/mL (ref 0.450–4.500)

## 2020-01-19 LAB — CBC WITH DIFFERENTIAL/PLATELET
Basophils Absolute: 0.1 10*3/uL (ref 0.0–0.2)
Basos: 2 %
EOS (ABSOLUTE): 0.2 10*3/uL (ref 0.0–0.4)
Eos: 3 %
Hematocrit: 39.2 % (ref 34.0–46.6)
Hemoglobin: 12.8 g/dL (ref 11.1–15.9)
Immature Grans (Abs): 0 10*3/uL (ref 0.0–0.1)
Immature Granulocytes: 0 %
Lymphocytes Absolute: 1.9 10*3/uL (ref 0.7–3.1)
Lymphs: 40 %
MCH: 30.4 pg (ref 26.6–33.0)
MCHC: 32.7 g/dL (ref 31.5–35.7)
MCV: 93 fL (ref 79–97)
Monocytes Absolute: 0.5 10*3/uL (ref 0.1–0.9)
Monocytes: 11 %
Neutrophils Absolute: 2.1 10*3/uL (ref 1.4–7.0)
Neutrophils: 44 %
Platelets: 214 10*3/uL (ref 150–450)
RBC: 4.21 x10E6/uL (ref 3.77–5.28)
RDW: 12.7 % (ref 11.7–15.4)
WBC: 4.7 10*3/uL (ref 3.4–10.8)

## 2020-01-19 LAB — HELICOBACTER PYLORI ABS-IGG+IGA, BLD
H. pylori, IgA Abs: <9 units (ref 0.0–8.9)
H. pylori, IgG AbS: 4.14 Index Value — ABNORMAL HIGH (ref 0.00–0.79)

## 2020-01-19 LAB — LIPASE: Lipase: 7 U/L — ABNORMAL LOW (ref 14–85)

## 2020-01-19 LAB — COMPREHENSIVE METABOLIC PANEL
ALT: 15 IU/L (ref 0–32)
AST: 20 IU/L (ref 0–40)
Albumin/Globulin Ratio: 2.2 (ref 1.2–2.2)
Albumin: 4.2 g/dL (ref 3.7–4.7)
Alkaline Phosphatase: 58 IU/L (ref 39–117)
BUN/Creatinine Ratio: 15 (ref 12–28)
BUN: 9 mg/dL (ref 8–27)
Bilirubin Total: 0.5 mg/dL (ref 0.0–1.2)
CO2: 25 mmol/L (ref 20–29)
Calcium: 9.2 mg/dL (ref 8.7–10.3)
Chloride: 101 mmol/L (ref 96–106)
Creatinine, Ser: 0.61 mg/dL (ref 0.57–1.00)
GFR calc Af Amer: 103 mL/min/{1.73_m2} (ref >59)
GFR calc non Af Amer: 90 mL/min/{1.73_m2} (ref >59)
Globulin, Total: 1.9 g/dL (ref 1.5–4.5)
Glucose: 94 mg/dL (ref 65–99)
Potassium: 4.7 mmol/L (ref 3.5–5.2)
Sodium: 138 mmol/L (ref 134–144)
Total Protein: 6.1 g/dL (ref 6.0–8.5)

## 2020-01-19 LAB — AMYLASE: Amylase: 28 U/L — ABNORMAL LOW (ref 31–110)

## 2020-01-20 ENCOUNTER — Other Ambulatory Visit: Payer: Self-pay | Admitting: Physician Assistant

## 2020-01-20 MED ORDER — METRONIDAZOLE 500 MG PO TABS: 500.0000 mg | ORAL_TABLET | Freq: Three times a day (TID) | ORAL | 0 refills | Status: DC

## 2020-01-20 MED ORDER — CLARITHROMYCIN 500 MG PO TABS: 500.0000 mg | ORAL_TABLET | Freq: Two times a day (BID) | ORAL | 0 refills | Status: DC

## 2020-01-24 ENCOUNTER — Other Ambulatory Visit: Payer: Self-pay | Admitting: Physician Assistant

## 2020-01-24 MED ORDER — LORAZEPAM 1 MG PO TABS: 1.0000 mg | ORAL_TABLET | Freq: Every day | ORAL | 1 refills | Status: DC

## 2020-01-31 ENCOUNTER — Encounter: Payer: Self-pay | Admitting: Physician Assistant

## 2020-01-31 ENCOUNTER — Ambulatory Visit (INDEPENDENT_AMBULATORY_CARE_PROVIDER_SITE_OTHER): Payer: Medicare HMO | Admitting: Physician Assistant

## 2020-01-31 ENCOUNTER — Other Ambulatory Visit: Payer: Self-pay

## 2020-01-31 VITALS — BP 142/78 | HR 63 | Temp 98.0°F | Resp 16 | Ht 60.0 in | Wt 125.0 lb

## 2020-01-31 DIAGNOSIS — L309 Dermatitis, unspecified: Secondary | ICD-10-CM | POA: Diagnosis not present

## 2020-01-31 MED ORDER — TRIAMCINOLONE ACETONIDE 40 MG/ML IJ SUSP
60.0000 mg | Freq: Once | INTRAMUSCULAR | Status: AC
  Administered 2020-01-31: 60 mg via INTRAMUSCULAR

## 2020-01-31 NOTE — Assessment & Plan Note (Signed)
claritin 10mg  qd Kenalog  60 given IM Have pt hold antibiotics until rash cleared Then take one at a time until completed - if she breaks out again notify us

## 2020-01-31 NOTE — Progress Notes (Signed)
Acute Office Visit  Subjective:    Patient ID: Mary Carson, female    DOB: 27-Jul-1946, 74 y.o.   MRN: [id number]  Chief Complaint  Patient presents with  . Rash    HPI Patient is in today for rash - pt states on Saturday she broke out in a rash on her chest, arms and across the bridge of her nose - it is slightly pruritic She has been taking antibiotics for hpylori but also had put on new sunscreen and had been out mowing Across her nose she did have some blistering  Past Medical History:  Diagnosis Date  . Anxiety   . RMSF [facility] spotted fever)   . Squamous cell carcinoma of scalp   . Vitamin D deficiency   . Weakness     Past Surgical History:  Procedure Laterality Date  . ABDOMINAL HYSTERECTOMY      Family History  Problem Relation Age of Onset  . CAD Other     Social History   Socioeconomic History  . Marital status: Widowed    Spouse name: Not on file  . Number of children: 1  . Years of education: Not on file  . Highest education level: Not on file  Occupational History  . Occupation: retired  Tobacco Use  . Smoking status: Never Smoker  . Smokeless tobacco: Never Used  Substance and Sexual Activity  . Alcohol use: Never  . Drug use: Never  . Sexual activity: Not on file  Other Topics Concern  . Not on file  Social History Narrative  . Not on file   Social Determinants of Health   Financial Resource Strain:   . Difficulty of Paying Living Expenses:   Food Insecurity:   . Worried About Charity fundraiser in the Last Year:   . Arboriculturist in the Last Year:   Transportation Needs:   . Film/video editor (Medical):   Marland Kitchen Lack of Transportation (Non-Medical):   Physical Activity:   . Days of Exercise per Week:   . Minutes of Exercise per Session:   Stress:   . Feeling of Stress :   Social Connections:   . Frequency of Communication with Friends and Family:   . Frequency of Social Gatherings with Friends and Family:    . Attends Religious Services:   . Active Member of Clubs or Organizations:   . Attends Archivist Meetings:   Marland Kitchen Marital Status:   Intimate Partner Violence:   . Fear of Current or Ex-Partner:   . Emotionally Abused:   Marland Kitchen Physically Abused:   . Sexually Abused:      Current Outpatient Medications:  .  citalopram (CELEXA) 20 MG tablet, Take 1 tablet (20 mg total) by mouth daily., Disp: 90 tablet, Rfl: 0 .  clarithromycin (BIAXIN) 500 MG tablet, Take 1 tablet (500 mg total) by mouth 2 (two) times daily., Disp: 28 tablet, Rfl: 0 .  LORazepam (ATIVAN) 1 MG tablet, Take 1 tablet (1 mg total) by mouth at bedtime., Disp: 30 tablet, Rfl: 1 .  metroNIDAZOLE (FLAGYL) 500 MG tablet, Take 1 tablet (500 mg total) by mouth 3 (three) times daily., Disp: 42 tablet, Rfl: 0 .  omeprazole (PRILOSEC) 40 MG capsule, Take 1 capsule (40 mg total) by mouth daily., Disp: 30 capsule, Rfl: 2  Current Facility-Administered Medications:  .  triamcinolone acetonide (KENALOG-40) injection 60 mg, 60 mg, Intramuscular, Once, Marge Duncans, PA-C   Allergies  Allergen Reactions  .  Augmentin [Amoxicillin-Pot Clavulanate]   . Penicillins     CONSTITUTIONAL: Negative for chills, fatigue, fever, unintentional weight gain and unintentional weight loss.  E/N/T: Negative for ear pain, nasal congestion and sore throat.  CARDIOVASCULAR: Negative for chest pain, dizziness, palpitations and pedal edema.  RESPIRATORY: Negative for recent cough and dyspnea.   INTEGUMENTARY: see HPI      Objective:    PHYSICAL EXAM:   VS: BP (!) 142/78   Pulse 63   Temp 98 F (36.7 C)   Resp 16   Ht 5' (1.524 m)   Wt 125 lb (56.7 kg)   SpO2 97%   BMI 24.41 kg/m   GEN: Well nourished, well developed, in no acute distress  HEENT: normal external ears and nose - normal external auditory canals and TMS - hearing grossly normal - normal nasal mucosa and septum - Lips, Teeth and Gums - normal  Oropharynx - normal mucosa,  palate, and posterior pharynx  Cardiac: RRR; no murmurs, rubs, or gallops,no edema - no significant varicosities Respiratory:  normal respiratory rate and pattern with no distress - normal breath sounds with no rales, rhonchi, wheezes or rubs  Skin: vesicular/macular red rash across nose - some mild erythema across chest and lower arms    Wt Readings from Last 3 Encounters:  01/31/20 125 lb (56.7 kg)  01/18/20 125 lb (56.7 kg)  01/04/20 128 lb (58.1 kg)    Health Maintenance Due  Topic Date Due  . Hepatitis C Screening  Never done  . TETANUS/TDAP  Never done  . MAMMOGRAM  Never done  . COLONOSCOPY  Never done  . DEXA SCAN  Never done  . PNA vac Low Risk Adult (1 of 2 - PCV13) Never done    There are no preventive care reminders to display for this patient.        Assessment & Plan:   Problem List Items Addressed This Visit      Musculoskeletal and Integument   Dermatitis - Primary    claritin 10mg  qd Kenalog  60 given IM Have pt hold antibiotics until rash cleared Then take one at a time until completed - if she breaks out again notify us      Relevant Medications   triamcinolone acetonide (KENALOG-40) injection 60 mg (Start on 01/31/2020  8:45 AM)       Meds ordered this encounter  Medications  . triamcinolone acetonide (KENALOG-40) injection 60 mg     SARA R Rae Plotner, PA-C

## 2020-03-10 DIAGNOSIS — M9902 Segmental and somatic dysfunction of thoracic region: Secondary | ICD-10-CM | POA: Diagnosis not present

## 2020-03-10 DIAGNOSIS — M9903 Segmental and somatic dysfunction of lumbar region: Secondary | ICD-10-CM | POA: Diagnosis not present

## 2020-03-10 DIAGNOSIS — M9905 Segmental and somatic dysfunction of pelvic region: Secondary | ICD-10-CM | POA: Diagnosis not present

## 2020-03-10 DIAGNOSIS — M545 Low back pain: Secondary | ICD-10-CM | POA: Diagnosis not present

## 2020-03-16 DIAGNOSIS — K624 Stenosis of anus and rectum: Secondary | ICD-10-CM | POA: Diagnosis not present

## 2020-03-16 DIAGNOSIS — K5904 Chronic idiopathic constipation: Secondary | ICD-10-CM | POA: Diagnosis not present

## 2020-04-21 ENCOUNTER — Other Ambulatory Visit: Payer: Self-pay

## 2020-04-21 MED ORDER — CITALOPRAM HYDROBROMIDE 20 MG PO TABS: 20.0000 mg | ORAL_TABLET | Freq: Every day | ORAL | 0 refills | Status: DC

## 2020-04-21 MED ORDER — LORAZEPAM 1 MG PO TABS: 1.0000 mg | ORAL_TABLET | Freq: Every day | ORAL | 1 refills | Status: DC

## 2020-05-02 ENCOUNTER — Other Ambulatory Visit: Payer: Self-pay

## 2020-05-02 ENCOUNTER — Encounter: Payer: Self-pay | Admitting: Physician Assistant

## 2020-05-02 ENCOUNTER — Ambulatory Visit (INDEPENDENT_AMBULATORY_CARE_PROVIDER_SITE_OTHER): Payer: Medicare HMO | Admitting: Physician Assistant

## 2020-05-02 ENCOUNTER — Other Ambulatory Visit: Payer: Self-pay | Admitting: Physician Assistant

## 2020-05-02 VITALS — BP 140/92 | HR 62 | Temp 96.8°F | Ht 63.0 in | Wt 118.0 lb

## 2020-05-02 DIAGNOSIS — G4452 New daily persistent headache (NDPH): Secondary | ICD-10-CM | POA: Diagnosis not present

## 2020-05-02 DIAGNOSIS — R5383 Other fatigue: Secondary | ICD-10-CM

## 2020-05-02 DIAGNOSIS — W57XXXA Bitten or stung by nonvenomous insect and other nonvenomous arthropods, initial encounter: Secondary | ICD-10-CM | POA: Diagnosis not present

## 2020-05-02 DIAGNOSIS — R634 Abnormal weight loss: Secondary | ICD-10-CM

## 2020-05-02 LAB — POCT URINALYSIS DIPSTICK
Bilirubin, UA: NEGATIVE
Blood, UA: NEGATIVE
Glucose, UA: NEGATIVE
Ketones, UA: NEGATIVE
Leukocytes, UA: NEGATIVE
Nitrite, UA: NEGATIVE
Protein, UA: NEGATIVE
Spec Grav, UA: 1.01 (ref 1.010–1.025)
Urobilinogen, UA: 0.2 E.U./dL
pH, UA: 6.5 (ref 5.0–8.0)

## 2020-05-02 MED ORDER — TRAMADOL HCL 50 MG PO TABS: 50.0000 mg | ORAL_TABLET | Freq: Two times a day (BID) | ORAL | 0 refills | Status: AC | PRN

## 2020-05-02 MED ORDER — DOXYCYCLINE HYCLATE 100 MG PO TABS
100.0000 mg | ORAL_TABLET | Freq: Two times a day (BID) | ORAL | 0 refills | Status: DC
Start: 2020-05-02 — End: 2020-06-19

## 2020-05-02 NOTE — Progress Notes (Signed)
Acute Office Visit  Subjective:    Patient ID: Mary Carson, female    DOB: 11-29-1945, 74 y.o.   MRN: [id number]  Chief Complaint  Patient presents with  . Headache    HPI Patient is in today for complaints of a headache 'all over' and changing in location for 'weeks and weeks' - states she has had no dizziness, vision symptoms, nausea or vomiting Describes as dull to sharp pains that have stayed constantly but varies in location - could be back of neck, across both temples, forehead, behind eyes etc  Pt states she feels tired 'all the time' - says she has not felt like herself in over a year and a half - no more specific than that -- of note pt has been treated for tick borne illness and Hpylori during that time She actually had another tick bite on her scalp 04/24/2020  Pt does follow with GI and has appt at the end of the month (it is noted today she has lost 7 pounds over the past 3 months)  Past Medical History:  Diagnosis Date  . Anxiety   . RMSF [facility] spotted fever)   . Squamous cell carcinoma of scalp   . Vitamin D deficiency   . Weakness     Past Surgical History:  Procedure Laterality Date  . ABDOMINAL HYSTERECTOMY      Family History  Problem Relation Age of Onset  . CAD Other     Social History   Socioeconomic History  . Marital status: Widowed    Spouse name: Not on file  . Number of children: 1  . Years of education: Not on file  . Highest education level: Not on file  Occupational History  . Occupation: retired  Tobacco Use  . Smoking status: Never Smoker  . Smokeless tobacco: Never Used  Vaping Use  . Vaping Use: Never used  Substance and Sexual Activity  . Alcohol use: Never  . Drug use: Never  . Sexual activity: Not on file  Other Topics Concern  . Not on file  Social History Narrative  . Not on file   Social Determinants of Health   Financial Resource Strain:   . Difficulty of Paying Living Expenses:   Food  Insecurity:   . Worried About Charity fundraiser in the Last Year:   . Arboriculturist in the Last Year:   Transportation Needs:   . Film/video editor (Medical):   Marland Kitchen Lack of Transportation (Non-Medical):   Physical Activity:   . Days of Exercise per Week:   . Minutes of Exercise per Session:   Stress:   . Feeling of Stress :   Social Connections:   . Frequency of Communication with Friends and Family:   . Frequency of Social Gatherings with Friends and Family:   . Attends Religious Services:   . Active Member of Clubs or Organizations:   . Attends Archivist Meetings:   Marland Kitchen Marital Status:   Intimate Partner Violence:   . Fear of Current or Ex-Partner:   . Emotionally Abused:   Marland Kitchen Physically Abused:   . Sexually Abused:      Current Outpatient Medications:  .  ANUCORT-HC 25 MG suppository, Place rectally., Disp: , Rfl:  .  citalopram (CELEXA) 20 MG tablet, Take 1 tablet (20 mg total) by mouth daily., Disp: 90 tablet, Rfl: 0 .  LORazepam (ATIVAN) 1 MG tablet, Take 1 tablet (1 mg total) by  mouth at bedtime., Disp: 30 tablet, Rfl: 1 .  omeprazole (PRILOSEC) 40 MG capsule, Take 1 capsule (40 mg total) by mouth daily., Disp: 30 capsule, Rfl: 2 .  polyethylene glycol powder (GLYCOLAX/MIRALAX) 17 GM/SCOOP powder, SMARTSIG:1 Capful(s) By Mouth Twice Daily, Disp: , Rfl:  .  doxycycline (VIBRA-TABS) 100 MG tablet, Take 1 tablet (100 mg total) by mouth 2 (two) times daily., Disp: 20 tablet, Rfl: 0 .  traMADol (ULTRAM) 50 MG tablet, Take 1 tablet (50 mg total) by mouth every 12 (twelve) hours as needed for up to 5 days., Disp: 10 tablet, Rfl: 0   Allergies  Allergen Reactions  . Augmentin [Amoxicillin-Pot Clavulanate]   . Penicillins     CONSTITUTIONAL: see HPI E/N/T: Negative for ear pain, nasal congestion and sore throat.  CARDIOVASCULAR: Negative for chest pain, dizziness, palpitations and pedal edema.  RESPIRATORY: Negative for recent cough and dyspnea.   GASTROINTESTINAL: Negative for abdominal pain, acid reflux symptoms, constipation, diarrhea, nausea and vomiting.  MSK: has intermittent joint pains  INTEGUMENTARY: Negative for rash.  NEUROLOGICAL: see HPI PSYCHIATRIC: Negative for sleep disturbance and to question depression screen.  Negative for depression, negative for anhedonia.         Objective:    PHYSICAL EXAM:   VS: BP (!) 140/92 (BP Location: Left Arm, Patient Position: Sitting)   Pulse 62   Temp (!) 96.8 F (36 C) (Temporal)   Ht 5\' 3"  (1.6 m)   Wt 118 lb (53.5 kg)   SpO2 96%   BMI 20.90 kg/m   GEN: Well nourished, well developed, in no acute distress  HEENT: normal external ears and nose - normal external auditory canals and TMS - hearing grossly normal - normal nasal mucosa and septum - Lips, Teeth and Gums - normal  Oropharynx - normal mucosa, palate, and posterior pharynx Neck: no JVD or masses - no thyromegaly Cardiac: RRR; no murmurs, rubs, or gallops,no edema - no significant varicosities Respiratory:  normal respiratory rate and pattern with no distress - normal breath sounds with no rales, rhonchi, wheezes or rubs GI: normal bowel sounds, no masses or tenderness MS: no deformity or atrophy  Skin: warm and dry, no rash  Neuro:  Alert and Oriented x 3, Strength and sensation are intact - CN II-Xii grossly intact Psych: euthymic mood, appropriate affect and demeanor  Office Visit on 05/02/2020  Component Date Value Ref Range Status  . Glucose, UA 05/02/2020 Negative  Negative Final  . Bilirubin, UA 05/02/2020 neg   Final  . Ketones, UA 05/02/2020 neg   Final  . Spec Grav, UA 05/02/2020 1.010  1.010 - 1.025 Final  . Blood, UA 05/02/2020 neg   Final  . pH, UA 05/02/2020 6.5  5.0 - 8.0 Final  . Protein, UA 05/02/2020 Negative  Negative Final  . Urobilinogen, UA 05/02/2020 0.2  0.2 or 1.0 E.U./dL Final  . Nitrite, UA 05/02/2020 neg   Final  . Leukocytes, UA 05/02/2020 Negative  Negative Final    Wt  Readings from Last 3 Encounters:  05/02/20 118 lb (53.5 kg)  01/31/20 125 lb (56.7 kg)  01/18/20 125 lb (56.7 kg)    Health Maintenance Due  Topic Date Due  . Hepatitis C Screening  Never done  . COVID-19 Vaccine (1) Never done  . TETANUS/TDAP  Never done  . MAMMOGRAM  Never done  . COLONOSCOPY  Never done  . DEXA SCAN  Never done  . PNA vac Low Risk Adult (1 of 2 -  PCV13) Never done    There are no preventive care reminders to display for this patient.        Assessment & Plan:   Problem List Items Addressed This Visit    None    Visit Diagnoses    Other fatigue    -  Primary   Relevant Orders   POCT urinalysis dipstick (Completed)   CBC with Differential/Platelet   Comprehensive metabolic panel   Thyroid Panel With TSH   B12 and Folate Panel   Sedimentation rate   Tick bite, initial encounter       Relevant Orders   Rocky mtn spotted fvr ab, IgM-blood   Rocky mtn spotted fvr ab, IgG-blood   Lyme Ab/Western Blot Reflex   CT Head Wo Contrast   New daily persistent headache       Relevant Medications   traMADol (ULTRAM) 50 MG tablet   Other Relevant Orders   CBC with Differential/Platelet   Comprehensive metabolic panel   Thyroid Panel With TSH   Sedimentation rate   CT Head Wo Contrast   Weight loss       Relevant Orders   CBC with Differential/Platelet   Comprehensive metabolic panel   Thyroid Panel With TSH   B12 and Folate Panel   Sedimentation rate       Meds ordered this encounter  Medications  . doxycycline (VIBRA-TABS) 100 MG tablet    Sig: Take 1 tablet (100 mg total) by mouth 2 (two) times daily.    Dispense:  20 tablet    Refill:  0    Order Specific Question:   Supervising Provider    AnswerRochel Brome [id number]  . traMADol (ULTRAM) 50 MG tablet    Sig: Take 1 tablet (50 mg total) by mouth every 12 (twelve) hours as needed for up to 5 days.    Dispense:  10 tablet    Refill:  0    Order Specific Question:   Supervising Provider     Answer:   COX, Lynder Parents     [place], PA-C

## 2020-05-04 ENCOUNTER — Other Ambulatory Visit: Payer: Self-pay | Admitting: Physician Assistant

## 2020-05-04 LAB — COMPREHENSIVE METABOLIC PANEL
ALT: 20 IU/L (ref 0–32)
AST: 27 IU/L (ref 0–40)
Albumin/Globulin Ratio: 2.2 (ref 1.2–2.2)
Albumin: 4.2 g/dL (ref 3.7–4.7)
Alkaline Phosphatase: 56 IU/L (ref 48–121)
BUN/Creatinine Ratio: 15 (ref 12–28)
BUN: 9 mg/dL (ref 8–27)
Bilirubin Total: 0.5 mg/dL (ref 0.0–1.2)
CO2: 28 mmol/L (ref 20–29)
Calcium: 9.5 mg/dL (ref 8.7–10.3)
Chloride: 100 mmol/L (ref 96–106)
Creatinine, Ser: 0.62 mg/dL (ref 0.57–1.00)
GFR calc Af Amer: 103 mL/min/{1.73_m2} (ref >59)
GFR calc non Af Amer: 89 mL/min/{1.73_m2} (ref >59)
Globulin, Total: 1.9 g/dL (ref 1.5–4.5)
Glucose: 78 mg/dL (ref 65–99)
Potassium: 5.1 mmol/L (ref 3.5–5.2)
Sodium: 139 mmol/L (ref 134–144)
Total Protein: 6.1 g/dL (ref 6.0–8.5)

## 2020-05-04 LAB — CBC WITH DIFFERENTIAL/PLATELET
Basophils Absolute: 0.1 10*3/uL (ref 0.0–0.2)
Basos: 2 %
EOS (ABSOLUTE): 0.1 10*3/uL (ref 0.0–0.4)
Eos: 2 %
Hematocrit: 39.8 % (ref 34.0–46.6)
Hemoglobin: 12.9 g/dL (ref 11.1–15.9)
Immature Grans (Abs): 0 10*3/uL (ref 0.0–0.1)
Immature Granulocytes: 0 %
Lymphocytes Absolute: 2.2 10*3/uL (ref 0.7–3.1)
Lymphs: 38 %
MCH: 30.8 pg (ref 26.6–33.0)
MCHC: 32.4 g/dL (ref 31.5–35.7)
MCV: 95 fL (ref 79–97)
Monocytes Absolute: 0.7 10*3/uL (ref 0.1–0.9)
Monocytes: 12 %
Neutrophils Absolute: 2.7 10*3/uL (ref 1.4–7.0)
Neutrophils: 46 %
Platelets: 236 10*3/uL (ref 150–450)
RBC: 4.19 x10E6/uL (ref 3.77–5.28)
RDW: 13.1 % (ref 11.7–15.4)
WBC: 5.9 10*3/uL (ref 3.4–10.8)

## 2020-05-04 LAB — B12 AND FOLATE PANEL
Folate: 18 ng/mL (ref >3.0)
Vitamin B-12: 286 pg/mL (ref 232–1245)

## 2020-05-04 LAB — LYME AB/WESTERN BLOT REFLEX
LYME DISEASE AB, QUANT, IGM: <0.8 index (ref 0.00–0.79)
Lyme IgG/IgM Ab: <0.91 {ISR} (ref 0.00–0.90)

## 2020-05-04 LAB — THYROID PANEL WITH TSH
Free Thyroxine Index: 2 (ref 1.2–4.9)
T3 Uptake Ratio: 28 % (ref 24–39)
T4, Total: 7.1 ug/dL (ref 4.5–12.0)
TSH: 1.26 u[IU]/mL (ref 0.450–4.500)

## 2020-05-04 LAB — ROCKY MTN SPOTTED FVR AB, IGG-BLOOD: RMSF IgG: POSITIVE — AB

## 2020-05-04 LAB — SEDIMENTATION RATE: Sed Rate: 2 mm/hr (ref 0–40)

## 2020-05-04 LAB — ROCKY MTN SPOTTED FVR AB, IGM-BLOOD: RMSF IgM: 0.18 index (ref 0.00–0.89)

## 2020-05-04 LAB — RMSF, IGG, IFA: RMSF, IGG, IFA: <1:64 {titer}

## 2020-05-12 ENCOUNTER — Telehealth: Payer: Self-pay | Admitting: Family Medicine

## 2020-05-12 NOTE — Telephone Encounter (Signed)
Mary Carson called twice on Friday morning. She was following up on a call you made to her. Please call her on Monday. Thank you, Dr. Lisel Siegrist

## 2020-05-16 ENCOUNTER — Encounter: Payer: Self-pay | Admitting: Physician Assistant

## 2020-05-16 ENCOUNTER — Ambulatory Visit (INDEPENDENT_AMBULATORY_CARE_PROVIDER_SITE_OTHER): Payer: Medicare HMO | Admitting: Physician Assistant

## 2020-05-16 ENCOUNTER — Other Ambulatory Visit: Payer: Self-pay

## 2020-05-16 ENCOUNTER — Other Ambulatory Visit: Payer: Self-pay | Admitting: Physician Assistant

## 2020-05-16 VITALS — BP 112/76 | HR 57 | Temp 97.9°F | Ht 60.0 in | Wt 116.0 lb

## 2020-05-16 DIAGNOSIS — Z0000 Encounter for general adult medical examination without abnormal findings: Secondary | ICD-10-CM

## 2020-05-16 DIAGNOSIS — Z23 Encounter for immunization: Secondary | ICD-10-CM

## 2020-05-16 DIAGNOSIS — G4452 New daily persistent headache (NDPH): Secondary | ICD-10-CM | POA: Diagnosis not present

## 2020-05-16 DIAGNOSIS — R519 Headache, unspecified: Secondary | ICD-10-CM | POA: Diagnosis not present

## 2020-05-16 MED ORDER — BENZONATATE 100 MG PO CAPS
100.0000 mg | ORAL_CAPSULE | Freq: Two times a day (BID) | ORAL | 0 refills | Status: DC | PRN
Start: 2020-05-16 — End: 2020-07-17

## 2020-05-16 MED ORDER — LORAZEPAM 1 MG PO TABS: 1.0000 mg | ORAL_TABLET | Freq: Every day | ORAL | 1 refills | Status: DC

## 2020-05-16 NOTE — Assessment & Plan Note (Signed)
pneumo 23 given Follow up with Dr Melina Copa 8/5 (history of weight loss and no appetite) Pt scheduled for head CT today for history of headaches Follow up with me in one month for chronic issues

## 2020-05-16 NOTE — Patient Instructions (Signed)
  Ms. Elmes , Thank you for taking time to come for your Medicare Wellness Visit. I appreciate your ongoing commitment to your health goals. Please review the following plan we discussed and let me know if I can assist you in the future.   These are the goals we discussed: Goals   None     This is a list of the screening recommended for you and due dates:  Health Maintenance  Topic Date Due  .  Hepatitis C: One time screening is recommended by Center for Disease Control  (CDC) for  adults born from [birth year] through 1965.   Never done  . COVID-19 Vaccine (1) Never done  . Flu Shot  05/21/2020  . Mammogram  07/28/2020  . Pneumonia vaccines (2 of 2 - PCV13) 05/16/2021  . Colon Cancer Screening  08/05/2023  . Tetanus Vaccine  05/12/2029  . DEXA scan (bone density measurement)  Completed

## 2020-05-16 NOTE — Progress Notes (Signed)
Subjective:  Patient ID: Mary Carson, female    DOB: Apr 07, 1946  Age: 74 y.o. MRN: [id number]  Chief Complaint  Patient presents with   Annual Exam    Medicare Physical    HPI Encounter for general adult medical examination without abnormal findings  Physical ("At Risk" items are starred): Patient's last physical exam was 1 year ago .  Weight: Appropriate for height (BMI less than 27%) ;  Blood Pressure: Normal (BP less than 120/80) ;  Medical History: Patient history reviewed ; Family history reviewed ;  Allergies Reviewed: No change in current allergies ;  Medications Reviewed: Medications reviewed - no changes ;  Lipids: Normal lipid levels ;  Smoking: Life-long non-smoker ;  Alcohol/Drug Use: Is a non-drinker ; No illicit drug use ;  Patient is not afflicted from Stress Incontinence and Urge Incontinence  Safety: reviewed ; Patient wears a seat belt, has smoke detectors, has carbon monoxide detectors, practices appropriate gun safety, and wears sunscreen with extended sun exposure. Dental Care: biannual cleanings, brushes and flosses daily. Ophthalmology/Optometry: Annual visit.  Hearing loss: none Vision impairments: none  Last dexa 04/2018 Last mammogram normal 07/2019  Fall Risk  05/16/2020  Falls in the past year? 0  Number falls in past yr: 0  Injury with Fall? 0     Depression screen [facility] 2/9 05/16/2020  Decreased Interest 0  Down, Depressed, Hopeless 0  PHQ - 2 Score 0     Mini-Cog - 05/16/20 1024    Normal clock drawing test? yes    How many words correct? 3            Functional Status Survey: Is the patient deaf or have difficulty hearing?: No Does the patient have difficulty seeing, even when wearing glasses/contacts?: No Does the patient have difficulty concentrating, remembering, or making decisions?: No Does the patient have difficulty walking or climbing stairs?: No Does the patient have difficulty dressing or bathing?: No Does the  patient have difficulty doing errands alone such as visiting a doctor's office or shopping?: No   Social Hx   Social History   Socioeconomic History   Marital status: Widowed    Spouse name: Not on file   Number of children: 1   Years of education: Not on file   Highest education level: Not on file  Occupational History   Occupation: retired  Tobacco Use   Smoking status: Never Smoker   Smokeless tobacco: Never Used  Scientific laboratory technician Use: Never used  Substance and Sexual Activity   Alcohol use: Never   Drug use: Never   Sexual activity: Not on file  Other Topics Concern   Not on file  Social History Narrative   Not on file   Social Determinants of Health   Financial Resource Strain:    Difficulty of Paying Living Expenses:   Food Insecurity:    Worried About Charity fundraiser in the Last Year:    Arboriculturist in the Last Year:   Transportation Needs:    Film/video editor (Medical):    Lack of Transportation (Non-Medical):   Physical Activity:    Days of Exercise per Week:    Minutes of Exercise per Session:   Stress:    Feeling of Stress :   Social Connections:    Frequency of Communication with Friends and Family:    Frequency of Social Gatherings with Friends and Family:    Attends Religious Services:  Active Member of Clubs or Organizations:    Attends Music therapist:    Marital Status:    Past Medical History:  Diagnosis Date   Anxiety    RMSF [facility] spotted fever)    Squamous cell carcinoma of scalp    Vitamin D deficiency    Weakness    Family History  Problem Relation Age of Onset   CAD Other     Review of Systems  CONSTITUTIONAL: Negative for chills, fatigue, fever, unintentional weight gain and unintentional weight loss.  E/N/T: Negative for ear pain, nasal congestion and sore throat.  CARDIOVASCULAR: Negative for chest pain, dizziness, palpitations and pedal edema.    RESPIRATORY: Negative for recent cough and dyspnea.  GASTROINTESTINAL: Negative for abdominal pain, acid reflux symptoms, constipation, diarrhea, nausea and vomiting.  MSK: Negative for arthralgias and myalgias.  INTEGUMENTARY: Negative for rash.  NEUROLOGICAL: Negative for dizziness and headaches.  PSYCHIATRIC: Negative for sleep disturbance and to question depression screen.  Negative for depression, negative for anhedonia.      Objective:  BP 112/76 (BP Location: Left Arm, Patient Position: Sitting)    Pulse 57    Temp 97.9 F (36.6 C) (Temporal)    Ht 5' (1.524 m)    Wt 116 lb (52.6 kg)    SpO2 97%    BMI 22.65 kg/m   BP/Weight 05/16/2020 05/02/2020 06/16/785  Systolic BP 754 492 010  Diastolic BP 76 92 78  Wt. (Lbs) 116 118 125  BMI 22.65 20.9 24.41    Physical Exam PHYSICAL EXAM:   VS: BP 112/76 (BP Location: Left Arm, Patient Position: Sitting)    Pulse 57    Temp 97.9 F (36.6 C) (Temporal)    Ht 5' (1.524 m)    Wt 116 lb (52.6 kg)    SpO2 97%    BMI 22.65 kg/m   GEN: Well nourished, well developed, in no acute distress  HEENT: normal external ears and nose - normal external auditory canals and TMS - hearing grossly normal -- Lips, Teeth and Gums - normal  Oropharynx - normal mucosa, palate, and posterior pharynx Neck: no JVD or masses - no thyromegaly Cardiac: RRR; no murmurs, rubs, or gallops,no edema -  Respiratory:  normal respiratory rate and pattern with no distress - normal breath sounds with no rales, rhonchi, wheezes or rubs GI: normal bowel sounds, no masses or tenderness MS: no deformity or atrophy  Skin: warm and dry, no rash  Neuro:  Alert and Oriented x 3, Strength and sensation are intact - CN II-Xii grossly intact Psych: euthymic mood, appropriate affect and demeanor  Lab Results  Component Value Date   WBC 5.9 05/02/2020   HGB 12.9 05/02/2020   HCT 39.8 05/02/2020   PLT 236 05/02/2020   GLUCOSE 78 05/02/2020   CHOL 205 (H) 11/24/2019   TRIG 90  11/24/2019   HDL 87 11/24/2019   LDLCALC 102 (H) 11/24/2019   ALT 20 05/02/2020   AST 27 05/02/2020   NA 139 05/02/2020   K 5.1 05/02/2020   CL 100 05/02/2020   CREATININE 0.62 05/02/2020   BUN 9 05/02/2020   CO2 28 05/02/2020   TSH 1.260 05/02/2020      Assessment & Plan:  1. Routine medical exam  2. Need for prophylactic vaccination against Streptococcus pneumoniae (pneumococcus) - Pneumococcal polysaccharide vaccine 23-valent greater than or equal to 2yo subcutaneous/IM    No orders of the defined types were placed in this encounter.  These are the goals we discussed: Goals   None      This is a list of the screening recommended for you and due dates:  Health Maintenance  Topic Date Due   Flu Shot  05/21/2020   Mammogram  07/28/2020   Pneumonia vaccines (2 of 2 - PCV13) 05/16/2021   Colon Cancer Screening  08/05/2023   Tetanus Vaccine  05/12/2029   DEXA scan (bone density measurement)  Completed   COVID-19 Vaccine  Completed    Hepatitis C: One time screening is recommended by Center for Disease Control  (CDC) for  adults born from [birth year] through 1966.   Discontinued     AN INDIVIDUALIZED CARE PLAN: was established or reinforced today.   SELF MANAGEMENT: The patient and I together assessed ways to personally work towards obtaining the recommended goals  Support needs The patient and/or family needs were assessed and services were offered and not necessary at this time.    Follow-up: Return in about 4 weeks (around 06/13/2020) for follow up. [place] [phone]

## 2020-05-23 ENCOUNTER — Ambulatory Visit: Payer: Medicare HMO | Admitting: Physician Assistant

## 2020-06-09 DIAGNOSIS — J019 Acute sinusitis, unspecified: Secondary | ICD-10-CM | POA: Diagnosis not present

## 2020-06-13 DIAGNOSIS — M9905 Segmental and somatic dysfunction of pelvic region: Secondary | ICD-10-CM | POA: Diagnosis not present

## 2020-06-13 DIAGNOSIS — M9902 Segmental and somatic dysfunction of thoracic region: Secondary | ICD-10-CM | POA: Diagnosis not present

## 2020-06-13 DIAGNOSIS — M545 Low back pain: Secondary | ICD-10-CM | POA: Diagnosis not present

## 2020-06-13 DIAGNOSIS — M9903 Segmental and somatic dysfunction of lumbar region: Secondary | ICD-10-CM | POA: Diagnosis not present

## 2020-06-15 DIAGNOSIS — K5904 Chronic idiopathic constipation: Secondary | ICD-10-CM | POA: Diagnosis not present

## 2020-06-15 DIAGNOSIS — R1013 Epigastric pain: Secondary | ICD-10-CM | POA: Diagnosis not present

## 2020-06-19 ENCOUNTER — Ambulatory Visit (INDEPENDENT_AMBULATORY_CARE_PROVIDER_SITE_OTHER): Payer: Medicare HMO | Admitting: Physician Assistant

## 2020-06-19 ENCOUNTER — Other Ambulatory Visit: Payer: Self-pay

## 2020-06-19 ENCOUNTER — Encounter: Payer: Self-pay | Admitting: Physician Assistant

## 2020-06-19 VITALS — BP 138/84 | HR 61 | Temp 97.1°F | Wt 114.0 lb

## 2020-06-19 DIAGNOSIS — R634 Abnormal weight loss: Secondary | ICD-10-CM

## 2020-06-19 DIAGNOSIS — R69 Illness, unspecified: Secondary | ICD-10-CM | POA: Diagnosis not present

## 2020-06-19 DIAGNOSIS — E559 Vitamin D deficiency, unspecified: Secondary | ICD-10-CM | POA: Diagnosis not present

## 2020-06-19 DIAGNOSIS — F419 Anxiety disorder, unspecified: Secondary | ICD-10-CM

## 2020-06-19 DIAGNOSIS — E782 Mixed hyperlipidemia: Secondary | ICD-10-CM | POA: Diagnosis not present

## 2020-06-19 NOTE — Assessment & Plan Note (Signed)
Continue to watch diet - labwork pending

## 2020-06-19 NOTE — Progress Notes (Signed)
Established Patient Office Visit  Subjective:  Patient ID: Mary Carson, female    DOB: [birth year]  Age: 74 y.o. MRN: [id number]  CC:  Chief Complaint  Patient presents with  . Weight Loss    HPI Mary Carson presents for follow up weight loss - pt was referred back to Dr Melina Copa and has seen him recently - however pt states she did not discuss her weight loss at all and stated ' he should have seen that she was losing weight' --- will call his office to make him aware so further evaluation can be done  Pt with history of vit D deficiency - is not taking supplements but due for labwork  Pt with history of anxiety - is currently on citalopram and lorazepam - these medications working well for pt and voices no problems or concerns  Past Medical History:  Diagnosis Date  . Anxiety   . [facility] spotted fever)   . Squamous cell carcinoma of scalp   . Vitamin D deficiency   . Weakness     Past Surgical History:  Procedure Laterality Date  . ABDOMINAL HYSTERECTOMY      Family History  Problem Relation Age of Onset  . CAD Other     Social History   Socioeconomic History  . Marital status: Widowed    Spouse name: Not on file  . Number of children: 1  . Years of education: Not on file  . Highest education level: Not on file  Occupational History  . Occupation: retired  Tobacco Use  . Smoking status: Never Smoker  . Smokeless tobacco: Never Used  Vaping Use  . Vaping Use: Never used  Substance and Sexual Activity  . Alcohol use: Never  . Drug use: Never  . Sexual activity: Not on file  Other Topics Concern  . Not on file  Social History Narrative  . Not on file   Social Determinants of Health   Financial Resource Strain:   . Difficulty of Paying Living Expenses: Not on file  Food Insecurity:   . Worried About Charity fundraiser in the Last Year: Not on file  . Ran Out of Food in the Last Year: Not on file  Transportation Needs:    . Lack of Transportation (Medical): Not on file  . Lack of Transportation (Non-Medical): Not on file  Physical Activity:   . Days of Exercise per Week: Not on file  . Minutes of Exercise per Session: Not on file  Stress:   . Feeling of Stress : Not on file  Social Connections:   . Frequency of Communication with Friends and Family: Not on file  . Frequency of Social Gatherings with Friends and Family: Not on file  . Attends Religious Services: Not on file  . Active Member of Clubs or Organizations: Not on file  . Attends Archivist Meetings: Not on file  . Marital Status: Not on file  Intimate Partner Violence:   . Fear of Current or Ex-Partner: Not on file  . Emotionally Abused: Not on file  . Physically Abused: Not on file  . Sexually Abused: Not on file     Current Outpatient Medications:  .  ANUCORT-HC 25 MG suppository, Place rectally., Disp: , Rfl:  .  benzonatate (TESSALON) 100 MG capsule, Take 1 capsule (100 mg total) by mouth 2 (two) times daily as needed for cough., Disp: 20 capsule, Rfl: 0 .  citalopram (CELEXA) 20 MG tablet,  Take 1 tablet (20 mg total) by mouth daily., Disp: 90 tablet, Rfl: 0 .  LORazepam (ATIVAN) 1 MG tablet, Take 1 tablet (1 mg total) by mouth at bedtime., Disp: 30 tablet, Rfl: 1 .  pantoprazole (PROTONIX) 40 MG tablet, Take 40 mg by mouth daily., Disp: , Rfl:  .  polyethylene glycol powder (GLYCOLAX/MIRALAX) 17 GM/SCOOP powder, SMARTSIG:1 Capful(s) By Mouth Twice Daily, Disp: , Rfl:    Allergies  Allergen Reactions  . Augmentin [Amoxicillin-Pot Clavulanate]   . Penicillins     ROS CONSTITUTIONAL: see HPI E/N/T: Negative for ear pain, nasal congestion and sore throat.  CARDIOVASCULAR: Negative for chest pain, dizziness, palpitations and pedal edema.  RESPIRATORY: Negative for recent cough and dyspnea.  GASTROINTESTINAL: Negative for abdominal pain, acid reflux symptoms, constipation, diarrhea, nausea and vomiting.  MSK: Negative  for arthralgias and myalgias.  INTEGUMENTARY: Negative for rash.  PSYCHIATRIC: Negative for sleep disturbance and to question depression screen.  Negative for depression, negative for anhedonia.        Objective:    PHYSICAL EXAM:   VS: BP 138/84   Pulse 61   Temp (!) 97.1 F (36.2 C)   Wt 114 lb (51.7 kg)   SpO2 99%   BMI 22.26 kg/m   GEN: Well nourished, well developed, in no acute distress  Cardiac: RRR; no murmurs, rubs, or gallops,no edema - no significant varicosities Respiratory:  normal respiratory rate and pattern with no distress - normal breath sounds with no rales, rhonchi, wheezes or rubs GI: normal bowel sounds, no masses or tenderness MS: no deformity or atrophy  Skin: warm and dry, no rash  Neuro:  Alert and Oriented x 3, Strength and sensation are intact - CN II-Xii grossly intact Psych: euthymic mood, appropriate affect and demeanor  BP 138/84   Pulse 61   Temp (!) 97.1 F (36.2 C)   Wt 114 lb (51.7 kg)   SpO2 99%   BMI 22.26 kg/m  Wt Readings from Last 3 Encounters:  06/19/20 114 lb (51.7 kg)  05/16/20 116 lb (52.6 kg)  05/02/20 118 lb (53.5 kg)     Health Maintenance Due  Topic Date Due  . INFLUENZA VACCINE  05/21/2020    There are no preventive care reminders to display for this patient.  Lab Results  Component Value Date   TSH 1.260 05/02/2020   Lab Results  Component Value Date   WBC 5.9 05/02/2020   HGB 12.9 05/02/2020   HCT 39.8 05/02/2020   MCV 95 05/02/2020   PLT 236 05/02/2020   Lab Results  Component Value Date   NA 139 05/02/2020   K 5.1 05/02/2020   CO2 28 05/02/2020   GLUCOSE 78 05/02/2020   BUN 9 05/02/2020   CREATININE 0.62 05/02/2020   BILITOT 0.5 05/02/2020   ALKPHOS 56 05/02/2020   AST 27 05/02/2020   ALT 20 05/02/2020   PROT 6.1 05/02/2020   ALBUMIN 4.2 05/02/2020   CALCIUM 9.5 05/02/2020   Lab Results  Component Value Date   CHOL 205 (H) 11/24/2019   Lab Results  Component Value Date   HDL 87  11/24/2019   Lab Results  Component Value Date   LDLCALC 102 (H) 11/24/2019   Lab Results  Component Value Date   TRIG 90 11/24/2019   Lab Results  Component Value Date   CHOLHDL 2.4 11/24/2019   No results found for: HGBA1C    Assessment & Plan:   Problem List Items Addressed This Visit  Other   Vitamin D deficiency - Primary    Vit D level pending      Relevant Orders   VITAMIN D 25 Hydroxy (Vit-D Deficiency, Fractures)   Anxiety    Continue current meds as directed      Relevant Orders   TSH   Weight loss    labwork pending Continue efforts at healthy diet Will notify Dr Marisa Hua  Office of weight loss concern since she said she did not discuss with him - had recent appt and will follow up soon      Relevant Orders   CBC with Differential/Platelet   Comprehensive metabolic panel   Mixed hyperlipidemia    Continue to watch diet - labwork pending      Relevant Orders   Lipid panel      No orders of the defined types were placed in this encounter.   Follow-up: Return in about 2 months (around 08/19/2020) for chronic follow up.    SARA R Landyn Lorincz, PA-C

## 2020-06-19 NOTE — Assessment & Plan Note (Signed)
Vit D level pending 

## 2020-06-19 NOTE — Assessment & Plan Note (Signed)
Continue current meds as directed 

## 2020-06-19 NOTE — Assessment & Plan Note (Signed)
labwork pending Continue efforts at healthy diet Will notify Dr Marisa Hua  Office of weight loss concern since she said she did not discuss with him - had recent appt and will follow up soon

## 2020-06-20 LAB — COMPREHENSIVE METABOLIC PANEL
ALT: 46 IU/L — ABNORMAL HIGH (ref 0–32)
AST: 29 IU/L (ref 0–40)
Albumin/Globulin Ratio: 2.2 (ref 1.2–2.2)
Albumin: 4.3 g/dL (ref 3.7–4.7)
Alkaline Phosphatase: 60 IU/L (ref 48–121)
BUN/Creatinine Ratio: 14 (ref 12–28)
BUN: 8 mg/dL (ref 8–27)
Bilirubin Total: 0.4 mg/dL (ref 0.0–1.2)
CO2: 25 mmol/L (ref 20–29)
Calcium: 9.7 mg/dL (ref 8.7–10.3)
Chloride: 100 mmol/L (ref 96–106)
Creatinine, Ser: 0.57 mg/dL (ref 0.57–1.00)
GFR calc Af Amer: 106 mL/min/{1.73_m2} (ref >59)
GFR calc non Af Amer: 92 mL/min/{1.73_m2} (ref >59)
Globulin, Total: 2 g/dL (ref 1.5–4.5)
Glucose: 97 mg/dL (ref 65–99)
Potassium: 5 mmol/L (ref 3.5–5.2)
Sodium: 137 mmol/L (ref 134–144)
Total Protein: 6.3 g/dL (ref 6.0–8.5)

## 2020-06-20 LAB — LIPID PANEL
Chol/HDL Ratio: 3.2 ratio (ref 0.0–4.4)
Cholesterol, Total: 203 mg/dL — ABNORMAL HIGH (ref 100–199)
HDL: 63 mg/dL
LDL Chol Calc (NIH): 123 mg/dL — ABNORMAL HIGH (ref 0–99)
Triglycerides: 96 mg/dL (ref 0–149)
VLDL Cholesterol Cal: 17 mg/dL (ref 5–40)

## 2020-06-20 LAB — CBC WITH DIFFERENTIAL/PLATELET
Basophils Absolute: 0.1 10*3/uL (ref 0.0–0.2)
Basos: 1 %
EOS (ABSOLUTE): 0 10*3/uL (ref 0.0–0.4)
Eos: 1 %
Hematocrit: 38.4 % (ref 34.0–46.6)
Hemoglobin: 13.1 g/dL (ref 11.1–15.9)
Immature Grans (Abs): 0 10*3/uL (ref 0.0–0.1)
Immature Granulocytes: 0 %
Lymphocytes Absolute: 2.3 10*3/uL (ref 0.7–3.1)
Lymphs: 32 %
MCH: 32 pg (ref 26.6–33.0)
MCHC: 34.1 g/dL (ref 31.5–35.7)
MCV: 94 fL (ref 79–97)
Monocytes Absolute: 0.6 10*3/uL (ref 0.1–0.9)
Monocytes: 9 %
Neutrophils Absolute: 4 10*3/uL (ref 1.4–7.0)
Neutrophils: 57 %
Platelets: 262 10*3/uL (ref 150–450)
RBC: 4.1 x10E6/uL (ref 3.77–5.28)
RDW: 12.9 % (ref 11.7–15.4)
WBC: 7 10*3/uL (ref 3.4–10.8)

## 2020-06-20 LAB — VITAMIN D 25 HYDROXY (VIT D DEFICIENCY, FRACTURES): Vit D, 25-Hydroxy: 49.4 ng/mL (ref 30.0–100.0)

## 2020-06-20 LAB — CARDIOVASCULAR RISK ASSESSMENT

## 2020-06-20 LAB — TSH: TSH: 1.34 u[IU]/mL (ref 0.450–4.500)

## 2020-06-22 DIAGNOSIS — R1013 Epigastric pain: Secondary | ICD-10-CM | POA: Diagnosis not present

## 2020-06-22 DIAGNOSIS — R634 Abnormal weight loss: Secondary | ICD-10-CM | POA: Diagnosis not present

## 2020-06-27 DIAGNOSIS — R634 Abnormal weight loss: Secondary | ICD-10-CM | POA: Diagnosis not present

## 2020-06-27 DIAGNOSIS — N1330 Unspecified hydronephrosis: Secondary | ICD-10-CM | POA: Diagnosis not present

## 2020-06-28 DIAGNOSIS — R634 Abnormal weight loss: Secondary | ICD-10-CM | POA: Diagnosis not present

## 2020-06-29 ENCOUNTER — Telehealth: Payer: Self-pay

## 2020-06-29 NOTE — Telephone Encounter (Signed)
Patient called and left message stating that she would like a referral to a back doctor for her back, Have you already seen her for this? Or does the patient need to come in a for an appointment? LA

## 2020-06-29 NOTE — Telephone Encounter (Signed)
No I have not seen pt for her back pain --- recommend that either she come here first or she can try to call ortho on her own

## 2020-06-30 NOTE — Telephone Encounter (Signed)
Left detailed message informing patient of response and told the patient if she has any other questions or concerns to give [place] a call back. LA

## 2020-07-12 DIAGNOSIS — R1013 Epigastric pain: Secondary | ICD-10-CM | POA: Diagnosis not present

## 2020-07-12 DIAGNOSIS — K624 Stenosis of anus and rectum: Secondary | ICD-10-CM | POA: Diagnosis not present

## 2020-07-12 DIAGNOSIS — R195 Other fecal abnormalities: Secondary | ICD-10-CM | POA: Diagnosis not present

## 2020-07-12 DIAGNOSIS — R634 Abnormal weight loss: Secondary | ICD-10-CM | POA: Diagnosis not present

## 2020-07-15 ENCOUNTER — Other Ambulatory Visit: Payer: Self-pay | Admitting: Physician Assistant

## 2020-08-03 ENCOUNTER — Ambulatory Visit (INDEPENDENT_AMBULATORY_CARE_PROVIDER_SITE_OTHER): Payer: Medicare HMO

## 2020-08-03 ENCOUNTER — Encounter: Payer: Self-pay | Admitting: Physician Assistant

## 2020-08-03 DIAGNOSIS — Z23 Encounter for immunization: Secondary | ICD-10-CM | POA: Diagnosis not present

## 2020-08-03 NOTE — Progress Notes (Signed)
    Patient Name: Mary Carson Patient DOB: 06/09/1946  08/03/2020  [name] came in today for her 2021-2022 influenza vaccine.  Patient tolerated it well.   [name]

## 2020-08-17 DIAGNOSIS — R634 Abnormal weight loss: Secondary | ICD-10-CM | POA: Diagnosis not present

## 2020-08-17 DIAGNOSIS — K624 Stenosis of anus and rectum: Secondary | ICD-10-CM | POA: Diagnosis not present

## 2020-08-17 DIAGNOSIS — K5904 Chronic idiopathic constipation: Secondary | ICD-10-CM | POA: Diagnosis not present

## 2020-08-17 DIAGNOSIS — R195 Other fecal abnormalities: Secondary | ICD-10-CM | POA: Diagnosis not present

## 2020-08-18 ENCOUNTER — Other Ambulatory Visit: Payer: Self-pay

## 2020-08-18 ENCOUNTER — Ambulatory Visit (INDEPENDENT_AMBULATORY_CARE_PROVIDER_SITE_OTHER): Payer: Medicare HMO | Admitting: Nurse Practitioner

## 2020-08-18 ENCOUNTER — Encounter: Payer: Self-pay | Admitting: Nurse Practitioner

## 2020-08-18 VITALS — BP 164/102 | HR 81 | Temp 97.1°F | Ht 63.0 in | Wt 110.0 lb

## 2020-08-18 DIAGNOSIS — M542 Cervicalgia: Secondary | ICD-10-CM

## 2020-08-18 DIAGNOSIS — Z13820 Encounter for screening for osteoporosis: Secondary | ICD-10-CM | POA: Diagnosis not present

## 2020-08-18 DIAGNOSIS — F419 Anxiety disorder, unspecified: Secondary | ICD-10-CM

## 2020-08-18 DIAGNOSIS — R634 Abnormal weight loss: Secondary | ICD-10-CM | POA: Diagnosis not present

## 2020-08-18 DIAGNOSIS — E441 Mild protein-calorie malnutrition: Secondary | ICD-10-CM | POA: Diagnosis not present

## 2020-08-18 DIAGNOSIS — R69 Illness, unspecified: Secondary | ICD-10-CM | POA: Diagnosis not present

## 2020-08-18 DIAGNOSIS — Z1231 Encounter for screening mammogram for malignant neoplasm of breast: Secondary | ICD-10-CM

## 2020-08-18 DIAGNOSIS — M545 Low back pain, unspecified: Secondary | ICD-10-CM

## 2020-08-18 DIAGNOSIS — G8929 Other chronic pain: Secondary | ICD-10-CM | POA: Insufficient documentation

## 2020-08-18 NOTE — Patient Instructions (Addendum)
Perform back and neck exercises as tolerated Continue Aleve/Tylenol OTC for pain management We will call you with lab results and appointments for X-rays of neck/back, DEXA, and mammogram Continue medications as prescribed Return in 4-weeks for f/u of neck/back pain  Health Maintenance, Female Adopting a healthy lifestyle and getting preventive care are important in promoting health and wellness. Ask your health care provider about:  The right schedule for you to have regular tests and exams.  Things you can do on your own to prevent diseases and keep yourself healthy. What should I know about diet, weight, and exercise? Eat a healthy diet   Eat a diet that includes plenty of vegetables, fruits, low-fat dairy products, and lean protein.  Do not eat a lot of foods that are high in solid fats, added sugars, or sodium. Maintain a healthy weight Body mass index (BMI) is used to identify weight problems. It estimates body fat based on height and weight. Your health care provider can help determine your BMI and help you achieve or maintain a healthy weight. Get regular exercise Get regular exercise. This is one of the most important things you can do for your health. Most adults should:  Exercise for at least 150 minutes each week. The exercise should increase your heart rate and make you sweat (moderate-intensity exercise).  Do strengthening exercises at least twice a week. This is in addition to the moderate-intensity exercise.  Spend less time sitting. Even light physical activity can be beneficial. Watch cholesterol and blood lipids Have your blood tested for lipids and cholesterol at 75 years of age, then have this test every 5 years. Have your cholesterol levels checked more often if:  Your lipid or cholesterol levels are high.  You are older than 75 years of age.  You are at high risk for heart disease. What should I know about cancer screening? Depending on your health  history and family history, you may need to have cancer screening at various ages. This may include screening for:  Breast cancer.  Cervical cancer.  Colorectal cancer.  Skin cancer.  Lung cancer. What should I know about heart disease, diabetes, and high blood pressure? Blood pressure and heart disease  High blood pressure causes heart disease and increases the risk of stroke. This is more likely to develop in people who have high blood pressure readings, are of African descent, or are overweight.  Have your blood pressure checked: ? Every 3-5 years if you are 23-12 years of age. ? Every year if you are 38 years old or older. Diabetes Have regular diabetes screenings. This checks your fasting blood sugar level. Have the screening done:  Once every three years after age 14 if you are at a normal weight and have a low risk for diabetes.  More often and at a younger age if you are overweight or have a high risk for diabetes. What should I know about preventing infection? Hepatitis B If you have a higher risk for hepatitis B, you should be screened for this virus. Talk with your health care provider to find out if you are at risk for hepatitis B infection. Hepatitis C Testing is recommended for:  Everyone born from [birth year] through 1966.  Anyone with known risk factors for hepatitis C. Sexually transmitted infections (STIs)  Get screened for STIs, including gonorrhea and chlamydia, if: ? You are sexually active and are younger than 75 years of age. ? You are older than 75 years of age and your  health care provider tells you that you are at risk for this type of infection. ? Your sexual activity has changed since you were last screened, and you are at increased risk for chlamydia or gonorrhea. Ask your health care provider if you are at risk.  Ask your health care provider about whether you are at high risk for HIV. Your health care provider may recommend a prescription medicine to  help prevent HIV infection. If you choose to take medicine to prevent HIV, you should first get tested for HIV. You should then be tested every 3 months for as long as you are taking the medicine. Pregnancy  If you are about to stop having your period (premenopausal) and you may become pregnant, seek counseling before you get pregnant.  Take 400 to 800 micrograms (mcg) of folic acid every day if you become pregnant.  Ask for birth control (contraception) if you want to prevent pregnancy. Osteoporosis and menopause Osteoporosis is a disease in which the bones lose minerals and strength with aging. This can result in bone fractures. If you are 73 years old or older, or if you are at risk for osteoporosis and fractures, ask your health care provider if you should:  Be screened for bone loss.  Take a calcium or vitamin D supplement to lower your risk of fractures.  Be given hormone replacement therapy (HRT) to treat symptoms of menopause. Follow these instructions at home: Lifestyle  Do not use any products that contain nicotine or tobacco, such as cigarettes, e-cigarettes, and chewing tobacco. If you need help quitting, ask your health care provider.  Do not use street drugs.  Do not share needles.  Ask your health care provider for help if you need support or information about quitting drugs. Alcohol use  Do not drink alcohol if: ? Your health care provider tells you not to drink. ? You are pregnant, may be pregnant, or are planning to become pregnant.  If you drink alcohol: ? Limit how much you use to 0-1 drink a day. ? Limit intake if you are breastfeeding.  Be aware of how much alcohol is in your drink. In the [place] one drink equals one 12 oz bottle of beer (355 mL), one 5 oz glass of wine (148 mL), or one 1 oz glass of hard liquor (44 mL). General instructions  Schedule regular health, dental, and eye exams.  Stay current with your vaccines.  Tell your health care  provider if: ? You often feel depressed. ? You have ever been abused or do not feel safe at home. Summary  Adopting a healthy lifestyle and getting preventive care are important in promoting health and wellness.  Follow your health care provider's instructions about healthy diet, exercising, and getting tested or screened for diseases.  Follow your health care provider's instructions on monitoring your cholesterol and blood pressure. This information is not intended to replace advice given to you by your health care provider. Make sure you discuss any questions you have with your health care provider. Document Revised: 09/30/2018 Document Reviewed: 09/30/2018 Elsevier Patient Education  2020 [place]. Bone Density Test The bone density test uses a special type of X-ray to measure the amount of calcium and other minerals in your bones. It can measure bone density in the hip and the spine. The test procedure is similar to having a regular X-ray. This test may also be called:  Bone densitometry.  Bone mineral density test.  Dual-energy X-ray absorptiometry (DEXA). You may  have this test to:  Diagnose a condition that causes weak or thin bones (osteoporosis).  Screen you for osteoporosis.  Predict your risk for a broken bone (fracture).  Determine how well your osteoporosis treatment is working. Tell a health care provider about:  Any allergies you have.  All medicines you are taking, including vitamins, herbs, eye drops, creams, and over-the-counter medicines.  Any problems you or family members have had with anesthetic medicines.  Any blood disorders you have.  Any surgeries you have had.  Any medical conditions you have.  Whether you are pregnant or may be pregnant.  Any medical tests you have had within the past 14 days that used contrast material. What are the risks? Generally, this is a safe procedure. However, it does expose you to a small amount of radiation,  which can slightly increase your cancer risk. What happens before the procedure?  Do not take any calcium supplements starting 24 hours before your test.  Remove all metal jewelry, eyeglasses, dental appliances, and any other metal objects. What happens during the procedure?   You will lie down on an exam table. There will be an X-ray generator below you and an imaging device above you.  Other devices, such as boxes or braces, may be used to position your body properly for the scan.  The machine will slowly scan your body. You will need to keep still.  The images will show up on a screen in the room. Images will be examined by a specialist after your test is done. The procedure may vary among health care providers and hospitals. What happens after the procedure?  It is up to you to get your test results. Ask your health care provider, or the department that is doing the test, when your results will be ready. Summary  A bone density test is an imaging test that uses a type of X-ray to measure the amount of calcium and other minerals in your bones.  The test may be used to diagnose or screen you for a condition that causes weak or thin bones (osteoporosis), predict your risk for a broken bone (fracture), or determine how well your osteoporosis treatment is working.  Do not take any calcium supplements starting 24 hours before your test.  Ask your health care provider, or the department that is doing the test, when your results will be ready. This information is not intended to replace advice given to you by your health care provider. Make sure you discuss any questions you have with your health care provider. Document Revised: 10/23/2017 Document Reviewed: 08/11/2017 Elsevier Patient Education  [place] A mammogram is an X-ray of the breasts that is done to check for changes that are not normal. This test can screen for and find any changes that may suggest breast  cancer. Mammograms are regularly done on women. A man may have a mammogram if he has a lump or swelling in his breast. This test can also help to find other changes and variations in the breast. Tell a doctor:  About any allergies you have.  If you have breast implants.  If you have had previous breast disease, biopsy, or surgery.  If you are breastfeeding.  If you are younger than age 42.  If you have a family history of breast cancer.  Whether you are pregnant or may be pregnant. What are the risks? Generally, this is a safe procedure. However, problems may occur, including:  Exposure to radiation. Radiation  levels are very low with this test.  The results being misinterpreted.  The need for further tests.  The inability of the mammogram to detect certain cancers. What happens before the procedure?  Have this test done about 1-2 weeks after your period. This is usually when your breasts are the least tender.  If you are visiting a new doctor or clinic, send any past mammogram images to your new doctor's office.  Wash your breasts and under your arms the day of the test.  Do not use deodorants, perfumes, lotions, or powders on the day of the test.  Take off any jewelry from your neck.  Wear clothes that you can change into and out of easily. What happens during the procedure?   You will undress from the waist up. You will put on a gown.  You will stand in front of the X-ray machine.  Each breast will be placed between two plastic or glass plates. The plates will press down on your breast for a few seconds. Try to stay as relaxed as possible. This does not cause any harm to your breasts. Any discomfort you feel will be very brief.  X-rays will be taken from different angles of each breast. The procedure may vary among doctors and hospitals. What happens after the procedure?  The mammogram will be read by a specialist (radiologist).  You may need to do certain  parts of the test again. This depends on the quality of the images.  Ask when your test results will be ready. Make sure you get your test results.  You may go back to your normal activities. Summary  A mammogram is a low energy X-ray of the breasts that is done to check for abnormal changes. A man may have this test if he has a lump or swelling in his breast.  Before the procedure, tell your doctor about any breast problems that you have had in the past.  Have this test done about 1-2 weeks after your period.  For the test, each breast will be placed between two plastic or glass plates. The plates will press down on your breast for a few seconds.  The mammogram will be read by a specialist (radiologist). Ask when your test results will be ready. Make sure you get your test results. This information is not intended to replace advice given to you by your health care provider. Make sure you discuss any questions you have with your health care provider. Document Revised: 05/28/2018 Document Reviewed: 05/28/2018 Elsevier Patient Education  [place]. Back Exercises These exercises help to make your trunk and back strong. They also help to keep the lower back flexible. Doing these exercises can help to prevent back pain or lessen existing pain.  If you have back pain, try to do these exercises 2-3 times each day or as told by your doctor.  As you get better, do the exercises once each day. Repeat the exercises more often as told by your doctor.  To stop back pain from coming back, do the exercises once each day, or as told by your doctor. Exercises Single knee to chest Do these steps 3-5 times in a row for each leg: 1. Lie on your back on a firm bed or the floor with your legs stretched out. 2. Bring one knee to your chest. 3. Grab your knee or thigh with both hands and hold them it in place. 4. Pull on your knee until you feel a gentle  stretch in your lower back or  buttocks. 5. Keep doing the stretch for 10-30 seconds. 6. Slowly let go of your leg and straighten it. Pelvic tilt Do these steps 5-10 times in a row: 1. Lie on your back on a firm bed or the floor with your legs stretched out. 2. Bend your knees so they point up to the ceiling. Your feet should be flat on the floor. 3. Tighten your lower belly (abdomen) muscles to press your lower back against the floor. This will make your tailbone point up to the ceiling instead of pointing down to your feet or the floor. 4. Stay in this position for 5-10 seconds while you gently tighten your muscles and breathe evenly. Cat-cow Do these steps until your lower back bends more easily: 1. Get on your hands and knees on a firm surface. Keep your hands under your shoulders, and keep your knees under your hips. You may put padding under your knees. 2. Let your head hang down toward your chest. Tighten (contract) the muscles in your belly. Point your tailbone toward the floor so your lower back becomes rounded like the back of a cat. 3. Stay in this position for 5 seconds. 4. Slowly lift your head. Let the muscles of your belly relax. Point your tailbone up toward the ceiling so your back forms a sagging arch like the back of a cow. 5. Stay in this position for 5 seconds.  Press-ups Do these steps 5-10 times in a row: 1. Lie on your belly (face-down) on the floor. 2. Place your hands near your head, about shoulder-width apart. 3. While you keep your back relaxed and keep your hips on the floor, slowly straighten your arms to raise the top half of your body and lift your shoulders. Do not use your back muscles. You may change where you place your hands in order to make yourself more comfortable. 4. Stay in this position for 5 seconds. 5. Slowly return to lying flat on the floor.  Bridges Do these steps 10 times in a row: 1. Lie on your back on a firm surface. 2. Bend your knees so they point up to the ceiling.  Your feet should be flat on the floor. Your arms should be flat at your sides, next to your body. 3. Tighten your butt muscles and lift your butt off the floor until your waist is almost as high as your knees. If you do not feel the muscles working in your butt and the back of your thighs, slide your feet 1-2 inches farther away from your butt. 4. Stay in this position for 3-5 seconds. 5. Slowly lower your butt to the floor, and let your butt muscles relax. If this exercise is too easy, try doing it with your arms crossed over your chest. Belly crunches Do these steps 5-10 times in a row: 1. Lie on your back on a firm bed or the floor with your legs stretched out. 2. Bend your knees so they point up to the ceiling. Your feet should be flat on the floor. 3. Cross your arms over your chest. 4. Tip your chin a little bit toward your chest but do not bend your neck. 5. Tighten your belly muscles and slowly raise your chest just enough to lift your shoulder blades a tiny bit off of the floor. Avoid raising your body higher than that, because it can put too much stress on your low back. 6. Slowly lower your chest and  your head to the floor. Back lifts Do these steps 5-10 times in a row: 1. Lie on your belly (face-down) with your arms at your sides, and rest your forehead on the floor. 2. Tighten the muscles in your legs and your butt. 3. Slowly lift your chest off of the floor while you keep your hips on the floor. Keep the back of your head in line with the curve in your back. Look at the floor while you do this. 4. Stay in this position for 3-5 seconds. 5. Slowly lower your chest and your face to the floor. Contact a doctor if:  Your back pain gets a lot worse when you do an exercise.  Your back pain does not get better 2 hours after you exercise. If you have any of these problems, stop doing the exercises. Do not do them again unless your doctor says it is okay. Get help right away if:  You  have sudden, very bad back pain. If this happens, stop doing the exercises. Do not do them again unless your doctor says it is okay. This information is not intended to replace advice given to you by your health care provider. Make sure you discuss any questions you have with your health care provider. Document Revised: 07/02/2018 Document Reviewed: 07/02/2018 Elsevier Patient Education  [place]. Neck Exercises Ask your health care provider which exercises are safe for you. Do exercises exactly as told by your health care provider and adjust them as directed. It is normal to feel mild stretching, pulling, tightness, or discomfort as you do these exercises. Stop right away if you feel sudden pain or your pain gets worse. Do not begin these exercises until told by your health care provider. Neck exercises can be important for many reasons. They can improve strength and maintain flexibility in your neck, which will help your upper back and prevent neck pain. Stretching exercises Rotation neck stretching  1. Sit in a chair or stand up. 2. Place your feet flat on the floor, shoulder width apart. 3. Slowly turn your head (rotate) to the right until a slight stretch is felt. Turn it all the way to the right so you can look over your right shoulder. Do not tilt or tip your head. 4. Hold this position for 10-30 seconds. 5. Slowly turn your head (rotate) to the left until a slight stretch is felt. Turn it all the way to the left so you can look over your left shoulder. Do not tilt or tip your head. 6. Hold this position for 10-30 seconds. Repeat __________ times. Complete this exercise __________ times a day. Neck retraction 1. Sit in a sturdy chair or stand up. 2. Look straight ahead. Do not bend your neck. 3. Use your fingers to push your chin backward (retraction). Do not bend your neck for this movement. Continue to face straight ahead. If you are doing the exercise properly, you will feel a  slight sensation in your throat and a stretch at the back of your neck. 4. Hold the stretch for 1-2 seconds. Repeat __________ times. Complete this exercise __________ times a day. Strengthening exercises Neck press 1. Lie on your back on a firm bed or on the floor with a pillow under your head. 2. Use your neck muscles to push your head down on the pillow and straighten your spine. 3. Hold the position as well as you can. Keep your head facing up (in a neutral position) and your chin tucked.  4. Slowly count to 5 while holding this position. Repeat __________ times. Complete this exercise __________ times a day. Isometrics These are exercises in which you strengthen the muscles in your neck while keeping your neck still (isometrics). 1. Sit in a supportive chair and place your hand on your forehead. 2. Keep your head and face facing straight ahead. Do not flex or extend your neck while doing isometrics. 3. Push forward with your head and neck while pushing back with your hand. Hold for 10 seconds. 4. Do the sequence again, this time putting your hand against the back of your head. Use your head and neck to push backward against the hand pressure. 5. Finally, do the same exercise on either side of your head, pushing sideways against the pressure of your hand. Repeat __________ times. Complete this exercise __________ times a day. Prone head lifts 1. Lie face-down (prone position), resting on your elbows so that your chest and upper back are raised. 2. Start with your head facing downward, near your chest. Position your chin either on or near your chest. 3. Slowly lift your head upward. Lift until you are looking straight ahead. Then continue lifting your head as far back as you can comfortably stretch. 4. Hold your head up for 5 seconds. Then slowly lower it to your starting position. Repeat __________ times. Complete this exercise __________ times a day. Supine head lifts 1. Lie on your back  (supine position), bending your knees to point to the ceiling and keeping your feet flat on the floor. 2. Lift your head slowly off the floor, raising your chin toward your chest. 3. Hold for 5 seconds. Repeat __________ times. Complete this exercise __________ times a day. Scapular retraction 1. Stand with your arms at your sides. Look straight ahead. 2. Slowly pull both shoulders (scapulae) backward and downward (retraction) until you feel a stretch between your shoulder blades in your upper back. 3. Hold for 10-30 seconds. 4. Relax and repeat. Repeat __________ times. Complete this exercise __________ times a day. Contact a health care provider if:  Your neck pain or discomfort gets much worse when you do an exercise.  Your neck pain or discomfort does not improve within 2 hours after you exercise. If you have any of these problems, stop exercising right away. Do not do the exercises again unless your health care provider says that you can. Get help right away if:  You develop sudden, severe neck pain. If this happens, stop exercising right away. Do not do the exercises again unless your health care provider says that you can. This information is not intended to replace advice given to you by your health care provider. Make sure you discuss any questions you have with your health care provider. Document Revised: 08/05/2018 Document Reviewed: 08/05/2018 Elsevier Patient Education  [place].

## 2020-08-18 NOTE — Progress Notes (Addendum)
Subjective:  Patient ID: Mary Carson, female    DOB: 01-17-1946  Age: 74 y.o. MRN: [id number]  Chief Complaint  Patient presents with  . Anxiety    2 Month chronic follow up    HPI  Mary Carson is a 74 year old female present for follow-up of unintentional weight loss and anxiety. She is experiencing neck and back pain today.    Weight loss Mary Carson has had a gradual 17 pound unintentional weight loss over an 23-month time period. In office weight on 12/14/19 was 127 lb, BMI 22.32. She has lost an additional 4 pounds in 2 months since last visit on 06/19/20. Weight today 110 lb, BMI 19.49. Mary Carson states she prepares her own meals with microwave or air fryer and often eats alone. Denies loss of appetite or food scarcity. She does not drink supplement shakes such as Ensure or Boost.She takes vitamins Kela is being followed by GI, Dr Melina Copa, for idiopathic constipation, epigastric pain, and an anal fissure. Last colonoscopy 08/04/13 was normal. She denies nausea, vomiting, tarry or bloody bowel movements. No anemia noted in recent labs. Folate, B12, and Vit D levels normal.   Anxiety Mary Carson is being treated for anxiety with Citalopram 20 mg daily and Ativan 1 mg QHS. She states her symptoms are well-controlled. Denies insomnia, anxious thoughts, or nervousness.   Neck/Back pain Mary Carson is experiencing neck and lower back pain today she rates 5/10. States pain began 4-days ago after doing yard work.The pain is worse with activity and improves with rest. Treatments include Aleve,Tylenol, and heat. She denies numbness, tingling in upper or lower extremities,decreased grip strength, or change in bladder/bowel continence. Denies weakness in lower extremities, loss of balance or recent falls. States she has chronic intermittent neck and back pain secondary to a fall at work in January 2020 at [employer]. States the pain associated with the fall led her to retire after 21-years of employment. States she  has seen a chiropractor in the past with moderate improvement. She says she has never been seen by a spine specialist. Requested referral to [facility] in . She cannot recall last DEXA scan.     Current Outpatient Medications on File Prior to Visit  Medication Sig Dispense Refill  . citalopram (CELEXA) 20 MG tablet Take 1 tablet (20 mg total) by mouth daily. 90 tablet 0  . LORazepam (ATIVAN) 1 MG tablet Take 1 tablet (1 mg total) by mouth at bedtime. 30 tablet 1  . pantoprazole (PROTONIX) 40 MG tablet Take 40 mg by mouth daily.    . polyethylene glycol powder (GLYCOLAX/MIRALAX) 17 GM/SCOOP powder SMARTSIG:1 Capful(s) By Mouth Twice Daily     No current facility-administered medications on file prior to visit.   Past Medical History:  Diagnosis Date  . Anxiety   . RMSF [facility] spotted fever)   . Squamous cell carcinoma of scalp   . Vitamin D deficiency   . Weakness    Past Surgical History:  Procedure Laterality Date  . ABDOMINAL HYSTERECTOMY      Family History  Problem Relation Age of Onset  . CAD Other    Social History   Socioeconomic History  . Marital status: Widowed    Spouse name: Not on file  . Number of children: 1  . Years of education: Not on file  . Highest education level: Not on file  Occupational History  . Occupation: retired  Tobacco Use  . Smoking status: Never Smoker  . Smokeless tobacco:  Never Used  Vaping Use  . Vaping Use: Never used  Substance and Sexual Activity  . Alcohol use: Never  . Drug use: Never  . Sexual activity: Not on file  Other Topics Concern  . Not on file  Social History Narrative  . Not on file                                                              Review of Systems  Constitutional: Positive for unexpected weight change. Negative for appetite change, fatigue and fever.  HENT: Negative for congestion, ear pain, sinus pressure and sore throat.   Eyes: Negative  for pain.  Respiratory: Negative for cough, chest tightness, shortness of breath and wheezing.   Cardiovascular: Negative for chest pain and palpitations.  Gastrointestinal: Negative for abdominal pain, constipation, diarrhea, nausea and vomiting.  Genitourinary: Negative for dysuria and hematuria.  Musculoskeletal: Positive for arthralgias, back pain, neck pain and neck stiffness. Negative for joint swelling.  Skin: Negative for rash.  Neurological: Negative for dizziness, weakness and headaches.  Psychiatric/Behavioral: Negative for dysphoric mood. The patient is not nervous/anxious.      Objective:  BP (!) 164/102 (BP Location: Left Arm, Patient Position: Sitting)   Pulse 81   Temp (!) 97.1 F (36.2 C) (Temporal)   Ht 5\' 3"  (1.6 m)   Wt 110 lb (49.9 kg)   SpO2 97%   BMI 19.49 kg/m   BP/Weight 08/18/2020 06/19/2020 0/26/3785  Systolic BP 885 027 741  Diastolic BP 287 84 76  Wt. (Lbs) 110 114 116  BMI 19.49 22.26 22.65    Physical Exam Constitutional:      Appearance: Normal appearance.  HENT:     Right Ear: Tympanic membrane, ear canal and external ear normal.     Left Ear: Tympanic membrane, ear canal and external ear normal.     Nose: Nose normal.     Mouth/Throat:     Mouth: Mucous membranes are moist.  Cardiovascular:     Rate and Rhythm: Normal rate and regular rhythm.     Pulses: Normal pulses.     Heart sounds: Normal heart sounds.  Pulmonary:     Effort: Pulmonary effort is normal.     Breath sounds: Normal breath sounds.  Abdominal:     General: Bowel sounds are normal.     Palpations: Abdomen is soft.  Musculoskeletal:        General: Tenderness present.     Cervical back: Tenderness present. No swelling, edema, erythema or rigidity. Pain with movement present. Decreased range of motion.     Thoracic back: Normal.     Lumbar back: Tenderness and bony tenderness present. No swelling or edema. Normal range of motion. Negative right straight leg raise test  and negative left straight leg raise test.     Right lower leg: No edema.     Left lower leg: No edema.  Lymphadenopathy:     Cervical: No cervical adenopathy.  Skin:    General: Skin is warm and dry.     Capillary Refill: Capillary refill takes less than 2 seconds.  Neurological:     Mental Status: She is oriented to person, place, and time.  Psychiatric:        Mood and Affect: Mood normal.  Behavior: Behavior normal.        Thought Content: Thought content normal.        Judgment: Judgment normal.            Lab Results  Component Value Date   WBC 6.4 08/18/2020   HGB 13.7 08/18/2020   HCT 40.7 08/18/2020   PLT 220 08/18/2020   GLUCOSE 90 08/18/2020   CHOL 203 (H) 06/19/2020   TRIG 96 06/19/2020   HDL 63 06/19/2020   LDLCALC 123 (H) 06/19/2020   ALT 13 08/18/2020   AST 20 08/18/2020   NA 139 08/18/2020   K 4.4 08/18/2020   CL 100 08/18/2020   CREATININE 0.52 (L) 08/18/2020   BUN 9 08/18/2020   CO2 26 08/18/2020   TSH 1.070 08/18/2020      Assessment & Plan:     Perform back and neck exercises as tolerated Continue Aleve/Tylenol OTC for pain management We will call you with lab results and appointments for X-rays of neck/back, DEXA, and mammogram Continue medications as prescribed Return in 4-weeks for f/u of neck/back pain    Follow-up: 4-WEEKS  An After Visit Summary was printed and given to the patient.   [place] [phone]

## 2020-08-19 ENCOUNTER — Other Ambulatory Visit: Payer: Self-pay | Admitting: Physician Assistant

## 2020-08-19 LAB — COMPREHENSIVE METABOLIC PANEL
ALT: 13 IU/L (ref 0–32)
AST: 20 IU/L (ref 0–40)
Albumin/Globulin Ratio: 2.4 — ABNORMAL HIGH (ref 1.2–2.2)
Albumin: 4 g/dL (ref 3.7–4.7)
Alkaline Phosphatase: 55 IU/L (ref 44–121)
BUN/Creatinine Ratio: 17 (ref 12–28)
BUN: 9 mg/dL (ref 8–27)
Bilirubin Total: 0.5 mg/dL (ref 0.0–1.2)
CO2: 26 mmol/L (ref 20–29)
Calcium: 9.3 mg/dL (ref 8.7–10.3)
Chloride: 100 mmol/L (ref 96–106)
Creatinine, Ser: 0.52 mg/dL — ABNORMAL LOW (ref 0.57–1.00)
GFR calc Af Amer: 109 mL/min/{1.73_m2} (ref >59)
GFR calc non Af Amer: 94 mL/min/{1.73_m2} (ref >59)
Globulin, Total: 1.7 g/dL (ref 1.5–4.5)
Glucose: 90 mg/dL (ref 65–99)
Potassium: 4.4 mmol/L (ref 3.5–5.2)
Sodium: 139 mmol/L (ref 134–144)
Total Protein: 5.7 g/dL — ABNORMAL LOW (ref 6.0–8.5)

## 2020-08-19 LAB — CBC WITH DIFF/PLATELET
Basophils Absolute: 0.1 10*3/uL (ref 0.0–0.2)
Basos: 1 %
EOS (ABSOLUTE): 0.2 10*3/uL (ref 0.0–0.4)
Eos: 3 %
Hematocrit: 40.7 % (ref 34.0–46.6)
Hemoglobin: 13.7 g/dL (ref 11.1–15.9)
Immature Grans (Abs): 0 10*3/uL (ref 0.0–0.1)
Immature Granulocytes: 0 %
Lymphocytes Absolute: 2 10*3/uL (ref 0.7–3.1)
Lymphs: 31 %
MCH: 31.6 pg (ref 26.6–33.0)
MCHC: 33.7 g/dL (ref 31.5–35.7)
MCV: 94 fL (ref 79–97)
Monocytes Absolute: 0.5 10*3/uL (ref 0.1–0.9)
Monocytes: 8 %
Neutrophils Absolute: 3.6 10*3/uL (ref 1.4–7.0)
Neutrophils: 57 %
Platelets: 220 10*3/uL (ref 150–450)
RBC: 4.33 x10E6/uL (ref 3.77–5.28)
RDW: 12.8 % (ref 11.7–15.4)
WBC: 6.4 10*3/uL (ref 3.4–10.8)

## 2020-08-19 LAB — TSH: TSH: 1.07 u[IU]/mL (ref 0.450–4.500)

## 2020-08-19 NOTE — Progress Notes (Signed)
CBC: normal, no anemia noted; CMP: normal, protein slightly low at 5.7-encourage patient to eat high protein food: lean meats, nuts, eggs, and cheese. She needs to eat frequent snacks due to wt loss (17 pounds since 2/21) TSH: normal

## 2020-08-21 ENCOUNTER — Other Ambulatory Visit: Payer: Self-pay

## 2020-08-21 ENCOUNTER — Other Ambulatory Visit: Payer: Self-pay | Admitting: Nurse Practitioner

## 2020-08-21 DIAGNOSIS — R053 Chronic cough: Secondary | ICD-10-CM

## 2020-08-21 MED ORDER — LORAZEPAM 1 MG PO TABS: 1.0000 mg | ORAL_TABLET | Freq: Every day | ORAL | 1 refills | Status: DC

## 2020-08-21 MED ORDER — BENZONATATE 100 MG PO CAPS: 100.0000 mg | ORAL_CAPSULE | Freq: Three times a day (TID) | ORAL | 0 refills | Status: DC | PRN

## 2020-08-21 MED ORDER — CITALOPRAM HYDROBROMIDE 20 MG PO TABS: 20.0000 mg | ORAL_TABLET | Freq: Every day | ORAL | 0 refills | Status: DC

## 2020-08-21 NOTE — Telephone Encounter (Signed)
Pt sts that she went to pick up Benzonatate 100 mg and it was not at pharmacy but I don't see it on her medication list.

## 2020-08-24 ENCOUNTER — Telehealth: Payer: Self-pay

## 2020-08-24 NOTE — Telephone Encounter (Signed)
Pt notified of her DEXA/Mammogram appointment for 09/29/2020 arrive by 11:00 for an 11:30 appointment.

## 2020-08-25 ENCOUNTER — Telehealth: Payer: Self-pay

## 2020-08-25 NOTE — Telephone Encounter (Signed)
I returned a call to [place] regarding her order for an X-ray. I was unable to reach the pt but I Left a message. if she had an X-Ray she should have been given an order and taken it to the hospital to do. I asked for the pt to call back to speak with the nurse if she does not having this order.

## 2020-09-05 DIAGNOSIS — M545 Low back pain, unspecified: Secondary | ICD-10-CM | POA: Diagnosis not present

## 2020-09-05 DIAGNOSIS — M47816 Spondylosis without myelopathy or radiculopathy, lumbar region: Secondary | ICD-10-CM | POA: Diagnosis not present

## 2020-09-05 DIAGNOSIS — M542 Cervicalgia: Secondary | ICD-10-CM | POA: Diagnosis not present

## 2020-09-05 DIAGNOSIS — G8929 Other chronic pain: Secondary | ICD-10-CM | POA: Diagnosis not present

## 2020-09-05 DIAGNOSIS — M5459 Other low back pain: Secondary | ICD-10-CM | POA: Diagnosis not present

## 2020-09-12 DIAGNOSIS — M545 Low back pain, unspecified: Secondary | ICD-10-CM | POA: Diagnosis not present

## 2020-09-13 ENCOUNTER — Ambulatory Visit (INDEPENDENT_AMBULATORY_CARE_PROVIDER_SITE_OTHER): Payer: Medicare HMO | Admitting: Physician Assistant

## 2020-09-13 ENCOUNTER — Other Ambulatory Visit: Payer: Self-pay

## 2020-09-13 ENCOUNTER — Encounter: Payer: Self-pay | Admitting: Physician Assistant

## 2020-09-13 VITALS — BP 138/86 | HR 78 | Temp 97.5°F | Ht 63.0 in | Wt 108.0 lb

## 2020-09-13 DIAGNOSIS — I10 Essential (primary) hypertension: Secondary | ICD-10-CM

## 2020-09-13 DIAGNOSIS — M542 Cervicalgia: Secondary | ICD-10-CM

## 2020-09-13 DIAGNOSIS — R634 Abnormal weight loss: Secondary | ICD-10-CM | POA: Diagnosis not present

## 2020-09-13 DIAGNOSIS — R053 Chronic cough: Secondary | ICD-10-CM

## 2020-09-13 MED ORDER — LORAZEPAM 1 MG PO TABS: 1.0000 mg | ORAL_TABLET | Freq: Every day | ORAL | 1 refills | Status: AC

## 2020-09-13 MED ORDER — BENZONATATE 100 MG PO CAPS: 100.0000 mg | ORAL_CAPSULE | Freq: Three times a day (TID) | ORAL | 0 refills | Status: DC | PRN

## 2020-09-13 MED ORDER — LISINOPRIL 5 MG PO TABS: 5.0000 mg | ORAL_TABLET | Freq: Every day | ORAL | 1 refills | Status: DC

## 2020-09-13 NOTE — Progress Notes (Signed)
Acute Office Visit  Subjective:    Patient ID: Mary Carson, female    DOB: Jun 23, 1946, 74 y.o.   MRN: [id number]  Chief Complaint  Patient presents with   Neck Pain    follow up    HPI Patient is in today for follow up of neck pain - states she started physical therapy yesterday and actually has been feeling better - does feel like the therapy helped  Pt with history of weight loss - has lost 2 more pounds since last visit - she still is not doing any type of nutritional supplement despite she has been advised numerous times to start --- states she will start Boost once daily She is seeing Dr Melina Copa and finally a colonoscopy has been ordered on December 1  Pt has had elevated bp on the past several visits and states at home it has been elevated as well - she has a history of hypertension and at one time was on medication - she denies chest pain/sob/edema  Past Medical History:  Diagnosis Date   Anxiety    RMSF [facility] spotted fever)    Squamous cell carcinoma of scalp    Vitamin D deficiency    Weakness     Past Surgical History:  Procedure Laterality Date   ABDOMINAL HYSTERECTOMY      Family History  Problem Relation Age of Onset   CAD Other     Social History   Socioeconomic History   Marital status: Widowed    Spouse name: Not on file   Number of children: 1   Years of education: Not on file   Highest education level: Not on file  Occupational History   Occupation: retired  Tobacco Use   Smoking status: Never Smoker   Smokeless tobacco: Never Used  Scientific laboratory technician Use: Never used  Substance and Sexual Activity   Alcohol use: Never   Drug use: Never   Sexual activity: Not on file  Other Topics Concern   Not on file  Social History Narrative   Not on file   Social Determinants of Health   Financial Resource Strain:    Difficulty of Paying Living Expenses: Not on file  Food Insecurity:    Worried About  Charity fundraiser in the Last Year: Not on file   [place] in the Last Year: Not on file  Transportation Needs:    Lack of Transportation (Medical): Not on file   Lack of Transportation (Non-Medical): Not on file  Physical Activity:    Days of Exercise per Week: Not on file   Minutes of Exercise per Session: Not on file  Stress:    Feeling of Stress : Not on file  Social Connections:    Frequency of Communication with Friends and Family: Not on file   Frequency of Social Gatherings with Friends and Family: Not on file   Attends Religious Services: Not on file   Active Member of Clubs or Organizations: Not on file   Attends Archivist Meetings: Not on file   Marital Status: Not on file  Intimate Partner Violence:    Fear of Current or Ex-Partner: Not on file   Emotionally Abused: Not on file   Physically Abused: Not on file   Sexually Abused: Not on file    Outpatient Medications Prior to Visit  Medication Sig Dispense Refill   acetaminophen (TYLENOL) 325 MG tablet Take by mouth.  aspirin 81 MG EC tablet Take by mouth.     benzonatate (TESSALON) 100 MG capsule Take 1 capsule (100 mg total) by mouth 3 (three) times daily as needed for cough. 30 capsule 0   Cholecalciferol 25 MCG (1000 UT) tablet Take by mouth.     citalopram (CELEXA) 20 MG tablet Take 1 tablet (20 mg total) by mouth daily. 90 tablet 0   lidocaine (LIDODERM) 5 % Apply topically.     LORazepam (ATIVAN) 1 MG tablet Take 1 tablet (1 mg total) by mouth at bedtime. 30 tablet 1   Omega-3 Fatty Acids (FISH OIL) 1000 MG CAPS Take by mouth.     pantoprazole (PROTONIX) 40 MG tablet Take 40 mg by mouth daily.     polyethylene glycol powder (GLYCOLAX/MIRALAX) 17 GM/SCOOP powder SMARTSIG:1 Capful(s) By Mouth Twice Daily     benzonatate (TESSALON) 100 MG capsule TAKE 1 CAPSULE BY MOUTH TWICE DAILY AS NEEDED FOR COUGH 20 capsule 0   SUTAB [phone] MG TABS      No  facility-administered medications prior to visit.    Allergies  Allergen Reactions   Augmentin [Amoxicillin-Pot Clavulanate]    Penicillins     Review of Systems CONSTITUTIONAL: see HPI E/N/T: Negative for ear pain, nasal congestion and sore throat.  CARDIOVASCULAR: Negative for chest pain, dizziness, palpitations and pedal edema.  RESPIRATORY: Negative for recent cough and dyspnea.  GASTROINTESTINAL: see HPI         Objective:    Physical Exam PHYSICAL EXAM:   VS: BP 138/86 (BP Location: Left Arm, Patient Position: Sitting, Cuff Size: Normal)    Pulse 78    Temp (!) 97.5 F (36.4 C) (Temporal)    Ht 5\' 3"  (1.6 m)    Wt 108 lb (49 kg)    SpO2 98%    BMI 19.13 kg/m   GEN: Well nourished, well developed, in no acute distress - thin Cardiac: RRR; no murmurs, rubs, or gallops,no edema -  Respiratory:  normal respiratory rate and pattern with no distress - normal breath sounds with no rales, rhonchi, wheezes or rubs Psych: euthymic mood, appropriate affect and demeanor  BP 138/86 (BP Location: Left Arm, Patient Position: Sitting, Cuff Size: Normal)    Pulse 78    Temp (!) 97.5 F (36.4 C) (Temporal)    Ht 5\' 3"  (1.6 m)    Wt 108 lb (49 kg)    SpO2 98%    BMI 19.13 kg/m  Wt Readings from Last 3 Encounters:  09/13/20 108 lb (49 kg)  08/18/20 110 lb (49.9 kg)  06/19/20 114 lb (51.7 kg)    Health Maintenance Due  Topic Date Due   MAMMOGRAM  07/28/2020    There are no preventive care reminders to display for this patient.   Lab Results  Component Value Date   TSH 1.070 08/18/2020   Lab Results  Component Value Date   WBC 6.4 08/18/2020   HGB 13.7 08/18/2020   HCT 40.7 08/18/2020   MCV 94 08/18/2020   PLT 220 08/18/2020   Lab Results  Component Value Date   NA 139 08/18/2020   K 4.4 08/18/2020   CO2 26 08/18/2020   GLUCOSE 90 08/18/2020   BUN 9 08/18/2020   CREATININE 0.52 (L) 08/18/2020   BILITOT 0.5 08/18/2020   ALKPHOS 55 08/18/2020   AST 20  08/18/2020   ALT 13 08/18/2020   PROT 5.7 (L) 08/18/2020   ALBUMIN 4.0 08/18/2020   CALCIUM 9.3 08/18/2020  Lab Results  Component Value Date   CHOL 203 (H) 06/19/2020   Lab Results  Component Value Date   HDL 63 06/19/2020   Lab Results  Component Value Date   LDLCALC 123 (H) 06/19/2020   Lab Results  Component Value Date   TRIG 96 06/19/2020   Lab Results  Component Value Date   CHOLHDL 3.2 06/19/2020   No results found for: HGBA1C     Assessment & Plan:  1. Neck pain Continue physical therapy 2. Weight loss Recommend daily nutritional supplement Follow up for colonoscopy 3. Benign hypertension  rx for zestril 5mg  qd and follow up in one month  No orders of the defined types were placed in this encounter.   No orders of the defined types were placed in this encounter.     Follow-up: No follow-ups on file.  An After Visit Summary was printed and given to the patient.  Yetta Flock [facility] [phone]

## 2020-09-15 DIAGNOSIS — M545 Low back pain, unspecified: Secondary | ICD-10-CM | POA: Diagnosis not present

## 2020-09-20 DIAGNOSIS — R195 Other fecal abnormalities: Secondary | ICD-10-CM | POA: Diagnosis not present

## 2020-09-20 DIAGNOSIS — K5730 Diverticulosis of large intestine without perforation or abscess without bleeding: Secondary | ICD-10-CM | POA: Diagnosis not present

## 2020-09-20 LAB — HM COLONOSCOPY

## 2020-09-22 DIAGNOSIS — M545 Low back pain, unspecified: Secondary | ICD-10-CM | POA: Diagnosis not present

## 2020-09-27 DIAGNOSIS — M545 Low back pain, unspecified: Secondary | ICD-10-CM | POA: Diagnosis not present

## 2020-09-29 DIAGNOSIS — M8589 Other specified disorders of bone density and structure, multiple sites: Secondary | ICD-10-CM | POA: Diagnosis not present

## 2020-09-29 DIAGNOSIS — Z1231 Encounter for screening mammogram for malignant neoplasm of breast: Secondary | ICD-10-CM | POA: Diagnosis not present

## 2020-09-29 DIAGNOSIS — M810 Age-related osteoporosis without current pathological fracture: Secondary | ICD-10-CM | POA: Diagnosis not present

## 2020-09-29 DIAGNOSIS — N959 Unspecified menopausal and perimenopausal disorder: Secondary | ICD-10-CM | POA: Diagnosis not present

## 2020-09-29 DIAGNOSIS — Z780 Asymptomatic menopausal state: Secondary | ICD-10-CM | POA: Diagnosis not present

## 2020-10-06 DIAGNOSIS — M545 Low back pain, unspecified: Secondary | ICD-10-CM | POA: Diagnosis not present

## 2020-10-12 ENCOUNTER — Ambulatory Visit (INDEPENDENT_AMBULATORY_CARE_PROVIDER_SITE_OTHER): Payer: Medicare HMO | Admitting: Physician Assistant

## 2020-10-12 ENCOUNTER — Encounter: Payer: Self-pay | Admitting: Physician Assistant

## 2020-10-12 ENCOUNTER — Other Ambulatory Visit: Payer: Self-pay

## 2020-10-12 VITALS — BP 138/84 | HR 93 | Temp 97.2°F | Ht 63.0 in | Wt 104.6 lb

## 2020-10-12 DIAGNOSIS — I10 Essential (primary) hypertension: Secondary | ICD-10-CM | POA: Diagnosis not present

## 2020-10-12 DIAGNOSIS — M545 Low back pain, unspecified: Secondary | ICD-10-CM | POA: Diagnosis not present

## 2020-10-12 DIAGNOSIS — R634 Abnormal weight loss: Secondary | ICD-10-CM

## 2020-10-12 LAB — POCT URINALYSIS DIP (CLINITEK)
Bilirubin, UA: NEGATIVE
Blood, UA: NEGATIVE
Glucose, UA: NEGATIVE mg/dL
Ketones, POC UA: NEGATIVE mg/dL
Leukocytes, UA: NEGATIVE
Nitrite, UA: NEGATIVE
POC PROTEIN,UA: NEGATIVE
Spec Grav, UA: 1.025 (ref 1.010–1.025)
Urobilinogen, UA: 0.2 E.U./dL
pH, UA: 6 (ref 5.0–8.0)

## 2020-10-12 MED ORDER — LISINOPRIL 5 MG PO TABS: 5.0000 mg | ORAL_TABLET | Freq: Every day | ORAL | 1 refills | Status: DC

## 2020-10-12 MED ORDER — MIRTAZAPINE 15 MG PO TABS: 15.0000 mg | ORAL_TABLET | Freq: Every day | ORAL | 2 refills | Status: DC

## 2020-10-12 NOTE — Progress Notes (Signed)
Acute Office Visit  Subjective:    Patient ID: Mary Carson, female    DOB: 08/21/1946, 74 y.o.   MRN: [id number]  Chief Complaint  Patient presents with   Hypertension    Follow up    HPI Patient is in today for recheck bp - pt states she has been taking bp at home and it has been normal - she is currently on zestril 5mg  qd - requests refill Denies chest pain/sob  It is noted today that patient has lost 4 more pounds since last visit -- she states she is eating a few times per day and eats a variety of foods - she has not started any type of protein shakes/ensure as previously recommended Pt has seen Dr Melina Copa and had a negative workup per patient and states that he is unsure why she is having weight loss Pt has had a negative chest xray 7/21, negative mammogram 12/21,negative head CT 7/21,  negative CT abdomen 9/20 and negative abdominal ultrasound 9/21 and recent negative GI workup including colonoscopy (per pt - have sent for records)  Past Medical History:  Diagnosis Date   Anxiety    RMSF [facility] spotted fever)    Squamous cell carcinoma of scalp    Vitamin D deficiency    Weakness     Past Surgical History:  Procedure Laterality Date   ABDOMINAL HYSTERECTOMY      Family History  Problem Relation Age of Onset   CAD Other     Social History   Socioeconomic History   Marital status: Widowed    Spouse name: Not on file   Number of children: 1   Years of education: Not on file   Highest education level: Not on file  Occupational History   Occupation: retired  Tobacco Use   Smoking status: Never Smoker   Smokeless tobacco: Never Used  Scientific laboratory technician Use: Never used  Substance and Sexual Activity   Alcohol use: Never   Drug use: Never   Sexual activity: Not on file  Other Topics Concern   Not on file  Social History Narrative   Not on file   Social Determinants of Health   Financial Resource Strain: Not on  file  Food Insecurity: Not on file  Transportation Needs: Not on file  Physical Activity: Not on file  Stress: Not on file  Social Connections: Not on file  Intimate Partner Violence: Not on file    Outpatient Medications Prior to Visit  Medication Sig Dispense Refill   acetaminophen (TYLENOL) 325 MG tablet Take by mouth.     aspirin 81 MG EC tablet Take by mouth.     benzonatate (TESSALON) 100 MG capsule Take 1 capsule (100 mg total) by mouth 3 (three) times daily as needed for cough. 30 capsule 0   Cholecalciferol 25 MCG (1000 UT) tablet Take by mouth.     citalopram (CELEXA) 20 MG tablet Take 1 tablet (20 mg total) by mouth daily. 90 tablet 0   LORazepam (ATIVAN) 1 MG tablet Take 1 tablet (1 mg total) by mouth at bedtime. 30 tablet 1   Omega-3 Fatty Acids (FISH OIL) 1000 MG CAPS Take by mouth.     pantoprazole (PROTONIX) 40 MG tablet Take 40 mg by mouth daily.     polyethylene glycol powder (GLYCOLAX/MIRALAX) 17 GM/SCOOP powder SMARTSIG:1 Capful(s) By Mouth Twice Daily     lisinopril (ZESTRIL) 5 MG tablet Take 1 tablet (5 mg total) by mouth  daily. 30 tablet 1   No facility-administered medications prior to visit.    Allergies  Allergen Reactions   Augmentin [Amoxicillin-Pot Clavulanate]    Penicillins     Review of Systems CONSTITUTIONAL: see HPI E/N/T: Negative for ear pain, nasal congestion and sore throat.  CARDIOVASCULAR: Negative for chest pain, dizziness, palpitations and pedal edema.  RESPIRATORY: Negative for recent cough and dyspnea.  GASTROINTESTINAL: Negative for abdominal pain, acid reflux symptoms, constipation, diarrhea, nausea and vomiting.  GU - negative MSK: Negative for arthralgias and myalgias.  INTEGUMENTARY: Negative for rash.  NEUROLOGICAL: Negative for dizziness and headaches.  PSYCHIATRIC: Negative for sleep disturbance and to question depression screen.  Negative for depression, negative for anhedonia.         Objective:     Physical Exam PHYSICAL EXAM:   VS: BP 138/84 (BP Location: Left Arm, Patient Position: Sitting, Cuff Size: Small)    Pulse 93    Temp (!) 97.2 F (36.2 C) (Temporal)    Ht 5\' 3"  (1.6 m)    Wt 104 lb 9.6 oz (47.4 kg)    SpO2 95%    BMI 18.53 kg/m   GEN: Well nourished, well developed, in no acute distress  HEENT: normal external ears and nose - normal external auditory canals and TMS - hearing grossly normal - normal nasal mucosa and septum - Lips, Teeth and Gums - normal  Oropharynx - normal mucosa, palate, and posterior pharynx Neck: no JVD or masses - no thyromegaly Cardiac: RRR; no murmurs, rubs, or gallops,no edema -  Respiratory:  normal respiratory rate and pattern with no distress - normal breath sounds with no rales, rhonchi, wheezes or rubs GI: normal bowel sounds, no masses or tenderness MS: no deformity or atrophy  Skin: warm and dry, no rash  Neuro:  Alert and Oriented x 3, Strength and sensation are intact - CN II-Xii grossly intact Psych: euthymic mood, appropriate affect and demeanor  Office Visit on 10/12/2020  Component Date Value Ref Range Status   Glucose, UA 10/12/2020 negative  negative mg/dL Final   Bilirubin, UA 10/12/2020 negative  negative Final   Ketones, POC UA 10/12/2020 negative  negative mg/dL Final   Spec Grav, UA 10/12/2020 1.025  1.010 - 1.025 Final   Blood, UA 10/12/2020 negative  negative Final   pH, UA 10/12/2020 6.0  5.0 - 8.0 Final   POC PROTEIN,UA 10/12/2020 negative  negative, trace Final   Urobilinogen, UA 10/12/2020 0.2  0.2 or 1.0 E.U./dL Final   Nitrite, UA 10/12/2020 Negative  Negative Final   Leukocytes, UA 10/12/2020 Negative  Negative Final    BP 138/84 (BP Location: Left Arm, Patient Position: Sitting, Cuff Size: Small)    Pulse 93    Temp (!) 97.2 F (36.2 C) (Temporal)    Ht 5\' 3"  (1.6 m)    Wt 104 lb 9.6 oz (47.4 kg)    SpO2 95%    BMI 18.53 kg/m  Wt Readings from Last 3 Encounters:  10/12/20 104 lb 9.6 oz (47.4 kg)   09/13/20 108 lb (49 kg)  08/18/20 110 lb (49.9 kg)    There are no preventive care reminders to display for this patient.  There are no preventive care reminders to display for this patient.   Lab Results  Component Value Date   TSH 1.070 08/18/2020   Lab Results  Component Value Date   WBC 6.4 08/18/2020   HGB 13.7 08/18/2020   HCT 40.7 08/18/2020   MCV 94 08/18/2020  PLT 220 08/18/2020   Lab Results  Component Value Date   NA 139 08/18/2020   K 4.4 08/18/2020   CO2 26 08/18/2020   GLUCOSE 90 08/18/2020   BUN 9 08/18/2020   CREATININE 0.52 (L) 08/18/2020   BILITOT 0.5 08/18/2020   ALKPHOS 55 08/18/2020   AST 20 08/18/2020   ALT 13 08/18/2020   PROT 5.7 (L) 08/18/2020   ALBUMIN 4.0 08/18/2020   CALCIUM 9.3 08/18/2020   Lab Results  Component Value Date   CHOL 203 (H) 06/19/2020   Lab Results  Component Value Date   HDL 63 06/19/2020   Lab Results  Component Value Date   LDLCALC 123 (H) 06/19/2020   Lab Results  Component Value Date   TRIG 96 06/19/2020   Lab Results  Component Value Date   CHOLHDL 3.2 06/19/2020   No results found for: HGBA1C     Assessment & Plan:  1. Benign hypertension Continue current meds as directed 2. Weight loss - CBC with Differential/Platelet - Comprehensive metabolic panel - TSH - 123456 and Folate Panel - POCT URINALYSIS DIP (CLINITEK) -    Meds ordered this encounter  Medications   lisinopril (ZESTRIL) 5 MG tablet    Sig: Take 1 tablet (5 mg total) by mouth daily.    Dispense:  90 tablet    Refill:  1    Order Specific Question:   Supervising Provider    Answer:   Shelton Silvas   mirtazapine (REMERON) 15 MG tablet    Sig: Take 1 tablet (15 mg total) by mouth at bedtime.    Dispense:  30 tablet    Refill:  2    Order Specific Question:   Supervising Provider    AnswerShelton Silvas    Orders Placed This Encounter  Procedures   CBC with Differential/Platelet   Comprehensive  metabolic panel   TSH   123456 and Folate Panel   POCT URINALYSIS DIP (CLINITEK)       Follow-up: Return in about 3 weeks (around 11/02/2020).  An After Visit Summary was printed and given to the patient.  Yetta Flock [facility] [phone]

## 2020-10-13 LAB — COMPREHENSIVE METABOLIC PANEL
ALT: 18 IU/L (ref 0–32)
AST: 22 IU/L (ref 0–40)
Albumin/Globulin Ratio: 2.6 — ABNORMAL HIGH (ref 1.2–2.2)
Albumin: 4.1 g/dL (ref 3.7–4.7)
Alkaline Phosphatase: 59 IU/L (ref 44–121)
BUN/Creatinine Ratio: 23 (ref 12–28)
BUN: 14 mg/dL (ref 8–27)
Bilirubin Total: 0.3 mg/dL (ref 0.0–1.2)
CO2: 24 mmol/L (ref 20–29)
Calcium: 9 mg/dL (ref 8.7–10.3)
Chloride: 102 mmol/L (ref 96–106)
Creatinine, Ser: 0.6 mg/dL (ref 0.57–1.00)
GFR calc Af Amer: 103 mL/min/{1.73_m2} (ref >59)
GFR calc non Af Amer: 89 mL/min/{1.73_m2} (ref >59)
Globulin, Total: 1.6 g/dL (ref 1.5–4.5)
Glucose: 96 mg/dL (ref 65–99)
Potassium: 4.4 mmol/L (ref 3.5–5.2)
Sodium: 139 mmol/L (ref 134–144)
Total Protein: 5.7 g/dL — ABNORMAL LOW (ref 6.0–8.5)

## 2020-10-13 LAB — CBC WITH DIFFERENTIAL/PLATELET
Basophils Absolute: 0.1 10*3/uL (ref 0.0–0.2)
Basos: 2 %
EOS (ABSOLUTE): 0.1 10*3/uL (ref 0.0–0.4)
Eos: 1 %
Hematocrit: 40.2 % (ref 34.0–46.6)
Hemoglobin: 13.1 g/dL (ref 11.1–15.9)
Immature Grans (Abs): 0 10*3/uL (ref 0.0–0.1)
Immature Granulocytes: 0 %
Lymphocytes Absolute: 2.3 10*3/uL (ref 0.7–3.1)
Lymphs: 34 %
MCH: 30.9 pg (ref 26.6–33.0)
MCHC: 32.6 g/dL (ref 31.5–35.7)
MCV: 95 fL (ref 79–97)
Monocytes Absolute: 0.6 10*3/uL (ref 0.1–0.9)
Monocytes: 9 %
Neutrophils Absolute: 3.6 10*3/uL (ref 1.4–7.0)
Neutrophils: 54 %
Platelets: 229 10*3/uL (ref 150–450)
RBC: 4.24 x10E6/uL (ref 3.77–5.28)
RDW: 12.1 % (ref 11.7–15.4)
WBC: 6.6 10*3/uL (ref 3.4–10.8)

## 2020-10-13 LAB — B12 AND FOLATE PANEL
Folate: 11.6 ng/mL (ref >3.0)
Vitamin B-12: 419 pg/mL (ref 232–1245)

## 2020-10-13 LAB — TSH: TSH: 0.791 u[IU]/mL (ref 0.450–4.500)

## 2020-10-18 DIAGNOSIS — M545 Low back pain, unspecified: Secondary | ICD-10-CM | POA: Diagnosis not present

## 2020-10-25 DIAGNOSIS — M545 Low back pain, unspecified: Secondary | ICD-10-CM | POA: Diagnosis not present

## 2020-10-30 DIAGNOSIS — D485 Neoplasm of uncertain behavior of skin: Secondary | ICD-10-CM | POA: Diagnosis not present

## 2020-10-30 DIAGNOSIS — D1801 Hemangioma of skin and subcutaneous tissue: Secondary | ICD-10-CM | POA: Diagnosis not present

## 2020-10-30 DIAGNOSIS — D2239 Melanocytic nevi of other parts of face: Secondary | ICD-10-CM | POA: Diagnosis not present

## 2020-10-30 DIAGNOSIS — D225 Melanocytic nevi of trunk: Secondary | ICD-10-CM | POA: Diagnosis not present

## 2020-10-30 DIAGNOSIS — L821 Other seborrheic keratosis: Secondary | ICD-10-CM | POA: Diagnosis not present

## 2020-10-30 DIAGNOSIS — L570 Actinic keratosis: Secondary | ICD-10-CM | POA: Diagnosis not present

## 2020-11-01 DIAGNOSIS — M545 Low back pain, unspecified: Secondary | ICD-10-CM | POA: Diagnosis not present

## 2020-11-02 ENCOUNTER — Ambulatory Visit: Payer: Medicare HMO | Admitting: Physician Assistant

## 2020-11-06 ENCOUNTER — Ambulatory Visit: Payer: Medicare HMO | Admitting: Physician Assistant

## 2020-11-07 ENCOUNTER — Ambulatory Visit (INDEPENDENT_AMBULATORY_CARE_PROVIDER_SITE_OTHER): Payer: Medicare HMO | Admitting: Physician Assistant

## 2020-11-07 ENCOUNTER — Other Ambulatory Visit: Payer: Self-pay

## 2020-11-07 ENCOUNTER — Encounter: Payer: Self-pay | Admitting: Physician Assistant

## 2020-11-07 VITALS — BP 120/78 | HR 90 | Temp 96.3°F | Ht 63.0 in | Wt 104.2 lb

## 2020-11-07 DIAGNOSIS — R634 Abnormal weight loss: Secondary | ICD-10-CM

## 2020-11-07 DIAGNOSIS — J3089 Other allergic rhinitis: Secondary | ICD-10-CM | POA: Diagnosis not present

## 2020-11-07 DIAGNOSIS — I10 Essential (primary) hypertension: Secondary | ICD-10-CM | POA: Diagnosis not present

## 2020-11-07 DIAGNOSIS — J309 Allergic rhinitis, unspecified: Secondary | ICD-10-CM | POA: Insufficient documentation

## 2020-11-07 MED ORDER — TRIAMCINOLONE ACETONIDE 40 MG/ML IJ SUSP
60.0000 mg | Freq: Once | INTRAMUSCULAR | Status: AC
Start: 2020-11-07 — End: 2020-11-07
  Administered 2020-11-07: 60 mg via INTRAMUSCULAR

## 2020-11-07 NOTE — Progress Notes (Signed)
Acute Office Visit  Subjective:    Patient ID: Mary Carson, female    DOB: January 13, 1946, 75 y.o.   MRN: [id number]  Chief Complaint  Patient presents with  . Weight Loss    HPI Patient is in today for recheck bp - pt states she has been taking bp at home and it has been normal - she is currently on zestril $RemoveBe'5mg'OrblLdrTP$  qd - Denies chest pain/sob  It is noted today that patient has maintained her weight since last visit Pt has had a negative chest xray 7/21, negative mammogram 12/21,negative head CT 7/21,  negative CT abdomen 9/20 and negative abdominal ultrasound 9/21 and recent negative GI workup including colonoscopy (per pt - have sent for records) she has a follow up with Dr Melina Copa in March  Pt with history of seasonal allergies with runny nose and watery eyes - she requests a kenalog injection today  Past Medical History:  Diagnosis Date  . Anxiety   . RMSF [facility] spotted fever)   . Squamous cell carcinoma of scalp   . Vitamin D deficiency   . Weakness     Past Surgical History:  Procedure Laterality Date  . ABDOMINAL HYSTERECTOMY      Family History  Problem Relation Age of Onset  . CAD Other     Social History   Socioeconomic History  . Marital status: Widowed    Spouse name: Not on file  . Number of children: 1  . Years of education: Not on file  . Highest education level: Not on file  Occupational History  . Occupation: retired  Tobacco Use  . Smoking status: Never Smoker  . Smokeless tobacco: Never Used  Vaping Use  . Vaping Use: Never used  Substance and Sexual Activity  . Alcohol use: Never  . Drug use: Never  . Sexual activity: Not on file  Other Topics Concern  . Not on file  Social History Narrative  . Not on file   Social Determinants of Health   Financial Resource Strain: Not on file  Food Insecurity: Not on file  Transportation Needs: Not on file  Physical Activity: Not on file  Stress: Not on file  Social Connections:  Not on file  Intimate Partner Violence: Not on file    Outpatient Medications Prior to Visit  Medication Sig Dispense Refill  . acetaminophen (TYLENOL) 325 MG tablet Take by mouth.    Marland Kitchen aspirin 81 MG EC tablet Take by mouth.    . benzonatate (TESSALON) 100 MG capsule Take 1 capsule (100 mg total) by mouth 3 (three) times daily as needed for cough. 30 capsule 0  . Cholecalciferol 25 MCG (1000 UT) tablet Take by mouth.    . citalopram (CELEXA) 20 MG tablet Take 1 tablet (20 mg total) by mouth daily. 90 tablet 0  . lisinopril (ZESTRIL) 5 MG tablet Take 1 tablet (5 mg total) by mouth daily. 90 tablet 1  . LORazepam (ATIVAN) 1 MG tablet Take 1 tablet (1 mg total) by mouth at bedtime. 30 tablet 1  . mirtazapine (REMERON) 15 MG tablet Take 1 tablet (15 mg total) by mouth at bedtime. 30 tablet 2  . Omega-3 Fatty Acids (FISH OIL) 1000 MG CAPS Take by mouth.    . pantoprazole (PROTONIX) 40 MG tablet Take 40 mg by mouth daily.    . polyethylene glycol powder (GLYCOLAX/MIRALAX) 17 GM/SCOOP powder SMARTSIG:1 Capful(s) By Mouth Twice Daily     No facility-administered medications prior to  visit.    Allergies  Allergen Reactions  . Augmentin [Amoxicillin-Pot Clavulanate]   . Penicillins     Review of Systems CONSTITUTIONAL: see HPI E/N/T: see HPI CARDIOVASCULAR: Negative for chest pain, dizziness, palpitations and pedal edema.  RESPIRATORY: Negative for recent cough and dyspnea.  GASTROINTESTINAL: Negative for abdominal pain, acid reflux symptoms, constipation, diarrhea, nausea and vomiting.   screen.  Negative for depression, negative for anhedonia.         Objective:    Physical Exam PHYSICAL EXAM:   VS: BP 120/78 (BP Location: Left Arm, Patient Position: Sitting, Cuff Size: Normal)   Pulse 90   Temp (!) 96.3 F (35.7 C) (Temporal)   Ht $R'5\' 3"'uS$  (1.6 m)   Wt 104 lb 3.2 oz (47.3 kg)   SpO2 99%   BMI 18.46 kg/m   PHYSICAL EXAM:   VS: BP 120/78 (BP Location: Left Arm, Patient  Position: Sitting, Cuff Size: Normal)   Pulse 90   Temp (!) 96.3 F (35.7 C) (Temporal)   Ht $R'5\' 3"'Sm$  (1.6 m)   Wt 104 lb 3.2 oz (47.3 kg)   SpO2 99%   BMI 18.46 kg/m   GEN: Well nourished, well developed, in no acute distress  HEENT: normal external ears and nose - normal external auditory canals and TMS - hearing grossly normal - normal nasal mucosa and septum - Lips, Teeth and Gums - normal  Oropharynx - normal mucosa, palate, and posterior pharynx Cardiac: RRR; no murmurs, rubs, or gallops,no edema -Respiratory:  normal respiratory rate and pattern with no distress - normal breath sounds with no rales, rhonchi, wheezes or rubs Neuro:  Alert and Oriented x 3, Strength and sensation are intact - CN II-Xii grossly intact Psych: euthymic mood, appropriate affect and demeanor  No visits with results within 1 Day(s) from this visit.  Latest known visit with results is:  Office Visit on 10/12/2020  Component Date Value Ref Range Status  . WBC 10/12/2020 6.6  3.4 - 10.8 x10E3/uL Final  . RBC 10/12/2020 4.24  3.77 - 5.28 x10E6/uL Final  . Hemoglobin 10/12/2020 13.1  11.1 - 15.9 g/dL Final  . Hematocrit 10/12/2020 40.2  34.0 - 46.6 % Final  . MCV 10/12/2020 95  79 - 97 fL Final  . MCH 10/12/2020 30.9  26.6 - 33.0 pg Final  . MCHC 10/12/2020 32.6  31.5 - 35.7 g/dL Final  . RDW 10/12/2020 12.1  11.7 - 15.4 % Final  . Platelets 10/12/2020 229  150 - 450 x10E3/uL Final  . Neutrophils 10/12/2020 54  Not Estab. % Final  . Lymphs 10/12/2020 34  Not Estab. % Final  . Monocytes 10/12/2020 9  Not Estab. % Final  . Eos 10/12/2020 1  Not Estab. % Final  . Basos 10/12/2020 2  Not Estab. % Final  . Neutrophils Absolute 10/12/2020 3.6  1.4 - 7.0 x10E3/uL Final  . Lymphocytes Absolute 10/12/2020 2.3  0.7 - 3.1 x10E3/uL Final  . Monocytes Absolute 10/12/2020 0.6  0.1 - 0.9 x10E3/uL Final  . EOS (ABSOLUTE) 10/12/2020 0.1  0.0 - 0.4 x10E3/uL Final  . Basophils Absolute 10/12/2020 0.1  0.0 - 0.2 x10E3/uL  Final  . Immature Granulocytes 10/12/2020 0  Not Estab. % Final  . Immature Grans (Abs) 10/12/2020 0.0  0.0 - 0.1 x10E3/uL Final  . Glucose 10/12/2020 96  65 - 99 mg/dL Final  . BUN 10/12/2020 14  8 - 27 mg/dL Final  . Creatinine, Ser 10/12/2020 0.60  0.57 - 1.00 mg/dL  Final  . GFR calc non Af Amer 10/12/2020 89  >59 mL/min/1.73 Final  . GFR calc Af Amer 10/12/2020 103  >59 mL/min/1.73 Final   Comment: **In accordance with recommendations from the NKF-ASN Task force,**   [facility] is in the process of updating its eGFR calculation to the   2021 CKD-EPI creatinine equation that estimates kidney function   without a race variable.   . BUN/Creatinine Ratio 10/12/2020 23  12 - 28 Final  . Sodium 10/12/2020 139  134 - 144 mmol/L Final  . Potassium 10/12/2020 4.4  3.5 - 5.2 mmol/L Final  . Chloride 10/12/2020 102  96 - 106 mmol/L Final  . CO2 10/12/2020 24  20 - 29 mmol/L Final  . Calcium 10/12/2020 9.0  8.7 - 10.3 mg/dL Final  . Total Protein 10/12/2020 5.7* 6.0 - 8.5 g/dL Final  . Albumin 10/12/2020 4.1  3.7 - 4.7 g/dL Final  . Globulin, Total 10/12/2020 1.6  1.5 - 4.5 g/dL Final  . Albumin/Globulin Ratio 10/12/2020 2.6* 1.2 - 2.2 Final  . Bilirubin Total 10/12/2020 0.3  0.0 - 1.2 mg/dL Final  . Alkaline Phosphatase 10/12/2020 59  44 - 121 IU/L Final                 **Please note reference interval change**  . AST 10/12/2020 22  0 - 40 IU/L Final  . ALT 10/12/2020 18  0 - 32 IU/L Final  . TSH 10/12/2020 0.791  0.450 - 4.500 uIU/mL Final  . Vitamin B-12 10/12/2020 419  232 - 1,245 pg/mL Final  . Folate 10/12/2020 11.6  >3.0 ng/mL Final   Comment: A serum folate concentration of less than 3.1 ng/mL is considered to represent clinical deficiency.   . Glucose, UA 10/12/2020 negative  negative mg/dL Final  . Bilirubin, UA 10/12/2020 negative  negative Final  . Ketones, POC UA 10/12/2020 negative  negative mg/dL Final  . Spec Grav, UA 10/12/2020 1.025  1.010 - 1.025 Final  . Blood, UA  10/12/2020 negative  negative Final  . pH, UA 10/12/2020 6.0  5.0 - 8.0 Final  . POC PROTEIN,UA 10/12/2020 negative  negative, trace Final  . Urobilinogen, UA 10/12/2020 0.2  0.2 or 1.0 E.U./dL Final  . Nitrite, UA 10/12/2020 Negative  Negative Final  . Leukocytes, UA 10/12/2020 Negative  Negative Final    BP 120/78 (BP Location: Left Arm, Patient Position: Sitting, Cuff Size: Normal)   Pulse 90   Temp (!) 96.3 F (35.7 C) (Temporal)   Ht $R'5\' 3"'Wk$  (1.6 m)   Wt 104 lb 3.2 oz (47.3 kg)   SpO2 99%   BMI 18.46 kg/m  Wt Readings from Last 3 Encounters:  11/07/20 104 lb 3.2 oz (47.3 kg)  10/12/20 104 lb 9.6 oz (47.4 kg)  09/13/20 108 lb (49 kg)    Health Maintenance Due  Topic Date Due  . COVID-19 Vaccine (3 - Booster for Pfizer series) 10/22/2020    There are no preventive care reminders to display for this patient.   Lab Results  Component Value Date   TSH 0.791 10/12/2020   Lab Results  Component Value Date   WBC 6.6 10/12/2020   HGB 13.1 10/12/2020   HCT 40.2 10/12/2020   MCV 95 10/12/2020   PLT 229 10/12/2020   Lab Results  Component Value Date   NA 139 10/12/2020   K 4.4 10/12/2020   CO2 24 10/12/2020   GLUCOSE 96 10/12/2020   BUN 14 10/12/2020   CREATININE 0.60  10/12/2020   BILITOT 0.3 10/12/2020   ALKPHOS 59 10/12/2020   AST 22 10/12/2020   ALT 18 10/12/2020   PROT 5.7 (L) 10/12/2020   ALBUMIN 4.1 10/12/2020   CALCIUM 9.0 10/12/2020   Lab Results  Component Value Date   CHOL 203 (H) 06/19/2020   Lab Results  Component Value Date   HDL 63 06/19/2020   Lab Results  Component Value Date   LDLCALC 123 (H) 06/19/2020   Lab Results  Component Value Date   TRIG 96 06/19/2020   Lab Results  Component Value Date   CHOLHDL 3.2 06/19/2020   No results found for: HGBA1C     Assessment & Plan:  1. Benign hypertension Continue current meds as directed 2. Weight loss Continue to watch diet - protein supplements and follow up with GI as  directed 3. Seasonal allergic rhinitis Kenalog $RemoveBeforeDE'60mg'OOVJmvycgGQiSpO$  given IM -    Meds ordered this encounter  Medications  . triamcinolone acetonide (KENALOG-40) injection 60 mg    No orders of the defined types were placed in this encounter.      Follow-up: Return for 3 months with me fasting and 1 month nurse visit weight and bp check.  An After Visit Summary was printed and given to the patient.  Yetta Flock [facility] [phone]

## 2020-11-08 DIAGNOSIS — M545 Low back pain, unspecified: Secondary | ICD-10-CM | POA: Diagnosis not present

## 2020-11-15 DIAGNOSIS — M545 Low back pain, unspecified: Secondary | ICD-10-CM | POA: Diagnosis not present

## 2020-11-20 ENCOUNTER — Other Ambulatory Visit: Payer: Self-pay | Admitting: Nurse Practitioner

## 2020-11-23 ENCOUNTER — Encounter: Payer: Self-pay | Admitting: Physician Assistant

## 2020-12-07 ENCOUNTER — Ambulatory Visit (INDEPENDENT_AMBULATORY_CARE_PROVIDER_SITE_OTHER): Payer: Medicare HMO | Admitting: Physician Assistant

## 2020-12-07 ENCOUNTER — Other Ambulatory Visit: Payer: Self-pay

## 2020-12-07 VITALS — BP 130/82 | HR 82 | Wt 101.0 lb

## 2020-12-07 DIAGNOSIS — R059 Cough, unspecified: Secondary | ICD-10-CM | POA: Diagnosis not present

## 2020-12-07 DIAGNOSIS — R5383 Other fatigue: Secondary | ICD-10-CM

## 2020-12-07 DIAGNOSIS — R634 Abnormal weight loss: Secondary | ICD-10-CM | POA: Diagnosis not present

## 2020-12-07 MED ORDER — MIRTAZAPINE 30 MG PO TABS: 30.0000 mg | ORAL_TABLET | Freq: Every day | ORAL | 2 refills | Status: DC

## 2020-12-07 NOTE — Addendum Note (Signed)
Addended byMarge Duncans on: 12/07/2020 10:35 AM   Modules accepted: Orders

## 2020-12-07 NOTE — Progress Notes (Signed)
Acute Office Visit  Subjective:    Patient ID: Mary Carson, female    DOB: Feb 27, 1946, 75 y.o.   MRN: [id number]  Chief Complaint  Patient presents with  . Weight loss    HPI  It is noted today that patient has lost 3 more pounds since last visit -- she states she is eating a few times per day and eats a variety of foods - she is drinking 1 protein shake per day She was started on remeron but although she does not think it has helped she would like to try an increased dose of medication to see if helpful She feels tired and fatigued - only other symptom she mentions is a cough she has had 'through the winter' but thinks it is more due to her sinuses (which she is not on any allergy meds)  Last chest xray was normal 7/21 Pt has seen Dr Melina Copa and had a negative workup Pt has had a negative chest xray 7/21, negative mammogram 12/21,negative head CT 7/21,  negative CT abdomen 9/20 and negative abdominal ultrasound 9/21, negative EGD 07/12/20, and negative colonoscopy 09/20/2020 She states her depression / anxiety meds work well for her and her only concern now is her weight  Past Medical History:  Diagnosis Date  . Anxiety   . RMSF [facility] spotted fever)   . Squamous cell carcinoma of scalp   . Vitamin D deficiency   . Weakness     Past Surgical History:  Procedure Laterality Date  . ABDOMINAL HYSTERECTOMY      Family History  Problem Relation Age of Onset  . CAD Other     Social History   Socioeconomic History  . Marital status: Widowed    Spouse name: Not on file  . Number of children: 1  . Years of education: Not on file  . Highest education level: Not on file  Occupational History  . Occupation: retired  Tobacco Use  . Smoking status: Never Smoker  . Smokeless tobacco: Never Used  Vaping Use  . Vaping Use: Never used  Substance and Sexual Activity  . Alcohol use: Never  . Drug use: Never  . Sexual activity: Not on file  Other Topics Concern   . Not on file  Social History Narrative  . Not on file   Social Determinants of Health   Financial Resource Strain: Not on file  Food Insecurity: Not on file  Transportation Needs: Not on file  Physical Activity: Not on file  Stress: Not on file  Social Connections: Not on file  Intimate Partner Violence: Not on file    Outpatient Medications Prior to Visit  Medication Sig Dispense Refill  . acetaminophen (TYLENOL) 325 MG tablet Take by mouth.    Marland Kitchen aspirin 81 MG EC tablet Take by mouth.    . benzonatate (TESSALON) 100 MG capsule Take 1 capsule (100 mg total) by mouth 3 (three) times daily as needed for cough. 30 capsule 0  . Cholecalciferol 25 MCG (1000 UT) tablet Take by mouth.    . citalopram (CELEXA) 20 MG tablet Take 1 tablet by mouth once daily 90 tablet 0  . lisinopril (ZESTRIL) 5 MG tablet Take 1 tablet (5 mg total) by mouth daily. 90 tablet 1  . LORazepam (ATIVAN) 1 MG tablet Take 1 tablet (1 mg total) by mouth at bedtime. 30 tablet 1  . mirtazapine (REMERON) 15 MG tablet Take 1 tablet (15 mg total) by mouth at bedtime. 30 tablet  2  . Omega-3 Fatty Acids (FISH OIL) 1000 MG CAPS Take by mouth.    . pantoprazole (PROTONIX) 40 MG tablet Take 40 mg by mouth daily.    . polyethylene glycol powder (GLYCOLAX/MIRALAX) 17 GM/SCOOP powder SMARTSIG:1 Capful(s) By Mouth Twice Daily     No facility-administered medications prior to visit.    Allergies  Allergen Reactions  . Augmentin [Amoxicillin-Pot Clavulanate]   . Penicillins     Review of Systems CONSTITUTIONAL: see HPI E/N/T: Negative for ear pain, nasal congestion and sore throat.  CARDIOVASCULAR: Negative for chest pain, dizziness, palpitations and pedal edema.  RESPIRATORY: see HPI GASTROINTESTINAL: Negative for abdominal pain, acid reflux symptoms, constipation, diarrhea, nausea and vomiting.  MSK: Negative for arthralgias and myalgias.  INTEGUMENTARY: Negative for rash.  NEUROLOGICAL: Negative for dizziness and  headaches.  PSYCHIATRIC: Negative for sleep disturbance and to question depression screen.  Negative for depression, negative for anhedonia.             Objective:    Physical Exam PHYSICAL EXAM:   VS: BP 130/82 (BP Location: Left Arm, Patient Position: Sitting, Cuff Size: Normal)   Pulse 82   Wt 101 lb (45.8 kg)   SpO2 96%   BMI 17.89 kg/m   GEN:  well developed, in no acute distress - thin HEENT: normal external ears and nose - normal external auditory canals and TMS -- Lips, Teeth and Gums - normal  Oropharynx - normal mucosa, palate, and posterior pharynx Cardiac: RRR; no murmurs, rubs, or gallops,no edema -  Respiratory:  normal respiratory rate and pattern with no distress - normal breath sounds with no rales, rhonchi, wheezes or rubs Skin: warm and dry, no rash  Psych: euthymic mood, appropriate affect and demeanor  No visits with results within 1 Day(s) from this visit.  Latest known visit with results is:  Abstract on 11/23/2020  Component Date Value Ref Range Status  . HM Colonoscopy 09/20/2020 See Report (in chart)  See Report (in chart), Patient Reported Final    BP 130/82 (BP Location: Left Arm, Patient Position: Sitting, Cuff Size: Normal)   Pulse 82   Wt 101 lb (45.8 kg)   SpO2 96%   BMI 17.89 kg/m  Wt Readings from Last 3 Encounters:  12/07/20 101 lb (45.8 kg)  11/07/20 104 lb 3.2 oz (47.3 kg)  10/12/20 104 lb 9.6 oz (47.4 kg)    Health Maintenance Due  Topic Date Due  . COVID-19 Vaccine (3 - Booster for Pfizer series) 10/22/2020    There are no preventive care reminders to display for this patient.   Lab Results  Component Value Date   TSH 0.791 10/12/2020   Lab Results  Component Value Date   WBC 6.6 10/12/2020   HGB 13.1 10/12/2020   HCT 40.2 10/12/2020   MCV 95 10/12/2020   PLT 229 10/12/2020   Lab Results  Component Value Date   NA 139 10/12/2020   K 4.4 10/12/2020   CO2 24 10/12/2020   GLUCOSE 96 10/12/2020   BUN 14  10/12/2020   CREATININE 0.60 10/12/2020   BILITOT 0.3 10/12/2020   ALKPHOS 59 10/12/2020   AST 22 10/12/2020   ALT 18 10/12/2020   PROT 5.7 (L) 10/12/2020   ALBUMIN 4.1 10/12/2020   CALCIUM 9.0 10/12/2020   Lab Results  Component Value Date   CHOL 203 (H) 06/19/2020   Lab Results  Component Value Date   HDL 63 06/19/2020   Lab Results  Component Value Date  LDLCALC 123 (H) 06/19/2020   Lab Results  Component Value Date   TRIG 96 06/19/2020   Lab Results  Component Value Date   CHOLHDL 3.2 06/19/2020   No results found for: HGBA1C     Assessment & Plan:  1 - weight loss Continue follow up with Dr Melina Copa as directed labwork pending Increase remeron Weight check in one month  2 cough Start claritin qd Chest xray ordered -    No orders of the defined types were placed in this encounter.   Orders Placed This Encounter  Procedures  . DG Chest 2 View  . B12 and Folate Panel  . CBC with Differential/Platelet  . Comprehensive metabolic panel  . TSH       Follow-up: Return for 1 month nurse visit weight check.  An After Visit Summary was printed and given to the patient.  Yetta Flock [facility] [phone]

## 2020-12-08 LAB — COMPREHENSIVE METABOLIC PANEL
ALT: 22 IU/L (ref 0–32)
AST: 21 IU/L (ref 0–40)
Albumin/Globulin Ratio: 1.9 (ref 1.2–2.2)
Albumin: 4.2 g/dL (ref 3.7–4.7)
Alkaline Phosphatase: 50 IU/L (ref 44–121)
BUN/Creatinine Ratio: 32 — ABNORMAL HIGH (ref 12–28)
BUN: 18 mg/dL (ref 8–27)
Bilirubin Total: 0.4 mg/dL (ref 0.0–1.2)
CO2: 24 mmol/L (ref 20–29)
Calcium: 9.4 mg/dL (ref 8.7–10.3)
Chloride: 103 mmol/L (ref 96–106)
Creatinine, Ser: 0.57 mg/dL (ref 0.57–1.00)
GFR calc Af Amer: 105 mL/min/{1.73_m2} (ref >59)
GFR calc non Af Amer: 91 mL/min/{1.73_m2} (ref >59)
Globulin, Total: 2.2 g/dL (ref 1.5–4.5)
Glucose: 89 mg/dL (ref 65–99)
Potassium: 4.7 mmol/L (ref 3.5–5.2)
Sodium: 141 mmol/L (ref 134–144)
Total Protein: 6.4 g/dL (ref 6.0–8.5)

## 2020-12-08 LAB — CBC WITH DIFFERENTIAL/PLATELET
Basophils Absolute: 0.1 10*3/uL (ref 0.0–0.2)
Basos: 1 %
EOS (ABSOLUTE): 0.1 10*3/uL (ref 0.0–0.4)
Eos: 2 %
Hematocrit: 39.8 % (ref 34.0–46.6)
Hemoglobin: 12.9 g/dL (ref 11.1–15.9)
Immature Grans (Abs): 0 10*3/uL (ref 0.0–0.1)
Immature Granulocytes: 0 %
Lymphocytes Absolute: 2.2 10*3/uL (ref 0.7–3.1)
Lymphs: 33 %
MCH: 31.1 pg (ref 26.6–33.0)
MCHC: 32.4 g/dL (ref 31.5–35.7)
MCV: 96 fL (ref 79–97)
Monocytes Absolute: 0.7 10*3/uL (ref 0.1–0.9)
Monocytes: 10 %
Neutrophils Absolute: 3.7 10*3/uL (ref 1.4–7.0)
Neutrophils: 54 %
Platelets: 215 10*3/uL (ref 150–450)
RBC: 4.15 x10E6/uL (ref 3.77–5.28)
RDW: 11.6 % — ABNORMAL LOW (ref 11.7–15.4)
WBC: 6.8 10*3/uL (ref 3.4–10.8)

## 2020-12-08 LAB — TSH: TSH: 1.1 u[IU]/mL (ref 0.450–4.500)

## 2020-12-08 LAB — B12 AND FOLATE PANEL
Folate: 17.6 ng/mL (ref >3.0)
Vitamin B-12: 663 pg/mL (ref 232–1245)

## 2020-12-09 ENCOUNTER — Other Ambulatory Visit: Payer: Self-pay | Admitting: Physician Assistant

## 2020-12-09 DIAGNOSIS — R053 Chronic cough: Secondary | ICD-10-CM

## 2020-12-11 DIAGNOSIS — R634 Abnormal weight loss: Secondary | ICD-10-CM | POA: Diagnosis not present

## 2020-12-11 DIAGNOSIS — R7989 Other specified abnormal findings of blood chemistry: Secondary | ICD-10-CM | POA: Diagnosis not present

## 2020-12-11 DIAGNOSIS — R059 Cough, unspecified: Secondary | ICD-10-CM | POA: Diagnosis not present

## 2020-12-11 DIAGNOSIS — R195 Other fecal abnormalities: Secondary | ICD-10-CM | POA: Diagnosis not present

## 2020-12-12 DIAGNOSIS — R195 Other fecal abnormalities: Secondary | ICD-10-CM | POA: Diagnosis not present

## 2021-01-04 ENCOUNTER — Ambulatory Visit (INDEPENDENT_AMBULATORY_CARE_PROVIDER_SITE_OTHER): Payer: Medicare HMO

## 2021-01-04 ENCOUNTER — Other Ambulatory Visit: Payer: Self-pay

## 2021-01-04 ENCOUNTER — Other Ambulatory Visit: Payer: Self-pay | Admitting: Physician Assistant

## 2021-01-04 VITALS — BP 110/70 | HR 88 | Wt 96.4 lb

## 2021-01-04 DIAGNOSIS — M810 Age-related osteoporosis without current pathological fracture: Secondary | ICD-10-CM | POA: Diagnosis not present

## 2021-01-04 MED ORDER — DENOSUMAB 60 MG/ML ~~LOC~~ SOSY
60.0000 mg | PREFILLED_SYRINGE | Freq: Once | SUBCUTANEOUS | Status: AC
  Administered 2021-01-04: 60 mg via SUBCUTANEOUS

## 2021-01-04 MED ORDER — MEGESTROL ACETATE 20 MG PO TABS: 20.0000 mg | ORAL_TABLET | Freq: Every day | ORAL | 2 refills | Status: AC

## 2021-01-18 ENCOUNTER — Ambulatory Visit: Payer: Medicare HMO | Admitting: Physician Assistant

## 2021-02-04 DIAGNOSIS — R0602 Shortness of breath: Secondary | ICD-10-CM | POA: Diagnosis not present

## 2021-02-04 DIAGNOSIS — K6389 Other specified diseases of intestine: Secondary | ICD-10-CM | POA: Diagnosis not present

## 2021-02-04 DIAGNOSIS — J449 Chronic obstructive pulmonary disease, unspecified: Secondary | ICD-10-CM | POA: Diagnosis not present

## 2021-02-04 DIAGNOSIS — R0600 Dyspnea, unspecified: Secondary | ICD-10-CM | POA: Diagnosis not present

## 2021-02-04 DIAGNOSIS — R911 Solitary pulmonary nodule: Secondary | ICD-10-CM | POA: Diagnosis not present

## 2021-02-04 DIAGNOSIS — R627 Adult failure to thrive: Secondary | ICD-10-CM | POA: Diagnosis not present

## 2021-02-04 DIAGNOSIS — R0609 Other forms of dyspnea: Secondary | ICD-10-CM | POA: Diagnosis not present

## 2021-02-04 DIAGNOSIS — J984 Other disorders of lung: Secondary | ICD-10-CM | POA: Diagnosis not present

## 2021-02-04 DIAGNOSIS — K3189 Other diseases of stomach and duodenum: Secondary | ICD-10-CM | POA: Diagnosis not present

## 2021-02-04 DIAGNOSIS — R531 Weakness: Secondary | ICD-10-CM | POA: Diagnosis not present

## 2021-02-04 DIAGNOSIS — I2510 Atherosclerotic heart disease of native coronary artery without angina pectoris: Secondary | ICD-10-CM | POA: Diagnosis not present

## 2021-02-04 DIAGNOSIS — R918 Other nonspecific abnormal finding of lung field: Secondary | ICD-10-CM | POA: Diagnosis not present

## 2021-02-04 DIAGNOSIS — R634 Abnormal weight loss: Secondary | ICD-10-CM | POA: Diagnosis not present

## 2021-02-06 ENCOUNTER — Ambulatory Visit: Payer: Medicare HMO | Admitting: Physician Assistant

## 2021-02-08 DIAGNOSIS — I2510 Atherosclerotic heart disease of native coronary artery without angina pectoris: Secondary | ICD-10-CM | POA: Diagnosis not present

## 2021-02-08 DIAGNOSIS — I10 Essential (primary) hypertension: Secondary | ICD-10-CM | POA: Diagnosis not present

## 2021-02-08 DIAGNOSIS — R0600 Dyspnea, unspecified: Secondary | ICD-10-CM | POA: Diagnosis not present

## 2021-02-08 DIAGNOSIS — K903 Pancreatic steatorrhea: Secondary | ICD-10-CM | POA: Diagnosis not present

## 2021-02-08 DIAGNOSIS — M810 Age-related osteoporosis without current pathological fracture: Secondary | ICD-10-CM | POA: Diagnosis not present

## 2021-02-08 DIAGNOSIS — R69 Illness, unspecified: Secondary | ICD-10-CM | POA: Diagnosis not present

## 2021-02-08 DIAGNOSIS — R634 Abnormal weight loss: Secondary | ICD-10-CM | POA: Diagnosis not present

## 2021-02-08 DIAGNOSIS — R911 Solitary pulmonary nodule: Secondary | ICD-10-CM | POA: Diagnosis not present

## 2021-02-08 DIAGNOSIS — E441 Mild protein-calorie malnutrition: Secondary | ICD-10-CM | POA: Diagnosis not present

## 2021-02-19 DIAGNOSIS — Z20822 Contact with and (suspected) exposure to covid-19: Secondary | ICD-10-CM | POA: Diagnosis not present

## 2021-02-19 DIAGNOSIS — J80 Acute respiratory distress syndrome: Secondary | ICD-10-CM | POA: Diagnosis not present

## 2021-02-19 DIAGNOSIS — R531 Weakness: Secondary | ICD-10-CM | POA: Diagnosis not present

## 2021-02-19 DIAGNOSIS — R079 Chest pain, unspecified: Secondary | ICD-10-CM | POA: Diagnosis not present

## 2021-02-19 DIAGNOSIS — R000 Tachycardia, unspecified: Secondary | ICD-10-CM | POA: Diagnosis not present

## 2021-02-19 DIAGNOSIS — R911 Solitary pulmonary nodule: Secondary | ICD-10-CM | POA: Diagnosis not present

## 2021-02-19 DIAGNOSIS — I10 Essential (primary) hypertension: Secondary | ICD-10-CM | POA: Diagnosis not present

## 2021-02-19 DIAGNOSIS — R0600 Dyspnea, unspecified: Secondary | ICD-10-CM | POA: Diagnosis not present

## 2021-02-19 DIAGNOSIS — R0602 Shortness of breath: Secondary | ICD-10-CM | POA: Diagnosis not present

## 2021-03-06 DIAGNOSIS — R0600 Dyspnea, unspecified: Secondary | ICD-10-CM | POA: Diagnosis not present

## 2021-03-08 DIAGNOSIS — I2510 Atherosclerotic heart disease of native coronary artery without angina pectoris: Secondary | ICD-10-CM | POA: Diagnosis not present

## 2021-03-08 DIAGNOSIS — R5383 Other fatigue: Secondary | ICD-10-CM | POA: Diagnosis not present

## 2021-03-08 DIAGNOSIS — R0600 Dyspnea, unspecified: Secondary | ICD-10-CM | POA: Diagnosis not present

## 2021-03-08 DIAGNOSIS — Z7982 Long term (current) use of aspirin: Secondary | ICD-10-CM | POA: Diagnosis not present

## 2021-03-08 DIAGNOSIS — R9431 Abnormal electrocardiogram [ECG] [EKG]: Secondary | ICD-10-CM | POA: Diagnosis not present

## 2021-03-08 DIAGNOSIS — I10 Essential (primary) hypertension: Secondary | ICD-10-CM | POA: Diagnosis not present

## 2021-03-08 DIAGNOSIS — R634 Abnormal weight loss: Secondary | ICD-10-CM | POA: Diagnosis not present

## 2021-03-08 DIAGNOSIS — R0602 Shortness of breath: Secondary | ICD-10-CM | POA: Diagnosis not present

## 2021-03-08 DIAGNOSIS — R000 Tachycardia, unspecified: Secondary | ICD-10-CM | POA: Diagnosis not present

## 2021-03-12 DIAGNOSIS — R69 Illness, unspecified: Secondary | ICD-10-CM | POA: Diagnosis not present

## 2021-03-12 DIAGNOSIS — R0600 Dyspnea, unspecified: Secondary | ICD-10-CM | POA: Diagnosis not present

## 2021-03-12 DIAGNOSIS — R634 Abnormal weight loss: Secondary | ICD-10-CM | POA: Diagnosis not present

## 2021-03-12 DIAGNOSIS — R911 Solitary pulmonary nodule: Secondary | ICD-10-CM | POA: Diagnosis not present

## 2021-03-20 DIAGNOSIS — R69 Illness, unspecified: Secondary | ICD-10-CM | POA: Diagnosis not present

## 2021-03-20 DIAGNOSIS — F419 Anxiety disorder, unspecified: Secondary | ICD-10-CM | POA: Diagnosis not present

## 2021-03-20 DIAGNOSIS — E441 Mild protein-calorie malnutrition: Secondary | ICD-10-CM | POA: Diagnosis not present

## 2021-04-13 DIAGNOSIS — J301 Allergic rhinitis due to pollen: Secondary | ICD-10-CM | POA: Diagnosis not present

## 2021-04-13 DIAGNOSIS — R911 Solitary pulmonary nodule: Secondary | ICD-10-CM | POA: Diagnosis not present

## 2021-04-13 DIAGNOSIS — R0600 Dyspnea, unspecified: Secondary | ICD-10-CM | POA: Diagnosis not present

## 2021-04-13 DIAGNOSIS — R634 Abnormal weight loss: Secondary | ICD-10-CM | POA: Diagnosis not present

## 2021-04-13 DIAGNOSIS — R69 Illness, unspecified: Secondary | ICD-10-CM | POA: Diagnosis not present

## 2021-04-19 DIAGNOSIS — M810 Age-related osteoporosis without current pathological fracture: Secondary | ICD-10-CM | POA: Diagnosis not present

## 2021-04-19 DIAGNOSIS — Z79899 Other long term (current) drug therapy: Secondary | ICD-10-CM | POA: Diagnosis not present

## 2021-04-19 DIAGNOSIS — E441 Mild protein-calorie malnutrition: Secondary | ICD-10-CM | POA: Diagnosis not present

## 2021-04-19 DIAGNOSIS — Z681 Body mass index (BMI) 19 or less, adult: Secondary | ICD-10-CM | POA: Diagnosis not present

## 2021-04-19 DIAGNOSIS — E46 Unspecified protein-calorie malnutrition: Secondary | ICD-10-CM | POA: Diagnosis not present

## 2021-04-19 DIAGNOSIS — F419 Anxiety disorder, unspecified: Secondary | ICD-10-CM | POA: Diagnosis not present

## 2021-04-19 DIAGNOSIS — R69 Illness, unspecified: Secondary | ICD-10-CM | POA: Diagnosis not present

## 2021-04-26 DIAGNOSIS — E441 Mild protein-calorie malnutrition: Secondary | ICD-10-CM | POA: Diagnosis not present

## 2021-04-26 DIAGNOSIS — R634 Abnormal weight loss: Secondary | ICD-10-CM | POA: Diagnosis not present

## 2021-05-06 DIAGNOSIS — R262 Difficulty in walking, not elsewhere classified: Secondary | ICD-10-CM | POA: Diagnosis not present

## 2021-05-06 DIAGNOSIS — R531 Weakness: Secondary | ICD-10-CM | POA: Diagnosis not present

## 2021-05-06 DIAGNOSIS — I10 Essential (primary) hypertension: Secondary | ICD-10-CM | POA: Diagnosis not present

## 2021-05-06 DIAGNOSIS — E860 Dehydration: Secondary | ICD-10-CM | POA: Diagnosis not present

## 2021-05-06 DIAGNOSIS — Z681 Body mass index (BMI) 19 or less, adult: Secondary | ICD-10-CM | POA: Diagnosis not present

## 2021-05-06 DIAGNOSIS — R059 Cough, unspecified: Secondary | ICD-10-CM | POA: Diagnosis not present

## 2021-05-06 DIAGNOSIS — N390 Urinary tract infection, site not specified: Secondary | ICD-10-CM | POA: Diagnosis not present

## 2021-05-06 DIAGNOSIS — R69 Illness, unspecified: Secondary | ICD-10-CM | POA: Diagnosis not present

## 2021-05-06 DIAGNOSIS — I4891 Unspecified atrial fibrillation: Secondary | ICD-10-CM | POA: Diagnosis not present

## 2021-05-06 DIAGNOSIS — E43 Unspecified severe protein-calorie malnutrition: Secondary | ICD-10-CM | POA: Diagnosis not present

## 2021-05-06 DIAGNOSIS — R627 Adult failure to thrive: Secondary | ICD-10-CM | POA: Diagnosis not present

## 2021-05-08 DIAGNOSIS — I4891 Unspecified atrial fibrillation: Secondary | ICD-10-CM

## 2021-05-09 DIAGNOSIS — R059 Cough, unspecified: Secondary | ICD-10-CM | POA: Diagnosis not present

## 2021-05-11 DIAGNOSIS — R627 Adult failure to thrive: Secondary | ICD-10-CM | POA: Diagnosis not present

## 2021-05-12 DIAGNOSIS — M21371 Foot drop, right foot: Secondary | ICD-10-CM | POA: Diagnosis not present

## 2021-05-12 DIAGNOSIS — I5033 Acute on chronic diastolic (congestive) heart failure: Secondary | ICD-10-CM | POA: Diagnosis not present

## 2021-05-12 DIAGNOSIS — E876 Hypokalemia: Secondary | ICD-10-CM | POA: Diagnosis not present

## 2021-05-12 DIAGNOSIS — L89151 Pressure ulcer of sacral region, stage 1: Secondary | ICD-10-CM | POA: Diagnosis not present

## 2021-05-12 DIAGNOSIS — R262 Difficulty in walking, not elsewhere classified: Secondary | ICD-10-CM | POA: Diagnosis not present

## 2021-05-12 DIAGNOSIS — R5381 Other malaise: Secondary | ICD-10-CM | POA: Diagnosis not present

## 2021-05-12 DIAGNOSIS — E440 Moderate protein-calorie malnutrition: Secondary | ICD-10-CM | POA: Diagnosis not present

## 2021-05-12 DIAGNOSIS — R627 Adult failure to thrive: Secondary | ICD-10-CM | POA: Diagnosis not present

## 2021-05-12 DIAGNOSIS — R69 Illness, unspecified: Secondary | ICD-10-CM | POA: Diagnosis not present

## 2021-05-12 DIAGNOSIS — J9601 Acute respiratory failure with hypoxia: Secondary | ICD-10-CM | POA: Diagnosis not present

## 2021-05-12 DIAGNOSIS — I480 Paroxysmal atrial fibrillation: Secondary | ICD-10-CM | POA: Diagnosis not present

## 2021-05-12 DIAGNOSIS — I10 Essential (primary) hypertension: Secondary | ICD-10-CM | POA: Diagnosis not present

## 2021-05-14 ENCOUNTER — Other Ambulatory Visit: Payer: Self-pay

## 2021-05-14 ENCOUNTER — Emergency Department (HOSPITAL_COMMUNITY): Payer: Medicare HMO

## 2021-05-14 ENCOUNTER — Inpatient Hospital Stay (HOSPITAL_COMMUNITY)
Admission: EM | Admit: 2021-05-14 | Discharge: 2021-05-22 | DRG: 291 | Disposition: A | Discharge status: Home Health | Payer: Medicare HMO | Attending: Internal Medicine | Admitting: Internal Medicine

## 2021-05-14 ENCOUNTER — Encounter (HOSPITAL_COMMUNITY): Payer: Self-pay

## 2021-05-14 DIAGNOSIS — R54 Age-related physical debility: Secondary | ICD-10-CM | POA: Diagnosis present

## 2021-05-14 DIAGNOSIS — M5126 Other intervertebral disc displacement, lumbar region: Secondary | ICD-10-CM | POA: Diagnosis present

## 2021-05-14 DIAGNOSIS — I110 Hypertensive heart disease with heart failure: Principal | ICD-10-CM | POA: Diagnosis present

## 2021-05-14 DIAGNOSIS — Z66 Do not resuscitate: Secondary | ICD-10-CM | POA: Diagnosis present

## 2021-05-14 DIAGNOSIS — I5033 Acute on chronic diastolic (congestive) heart failure: Secondary | ICD-10-CM | POA: Diagnosis present

## 2021-05-14 DIAGNOSIS — I493 Ventricular premature depolarization: Secondary | ICD-10-CM | POA: Diagnosis present

## 2021-05-14 DIAGNOSIS — I10 Essential (primary) hypertension: Secondary | ICD-10-CM | POA: Diagnosis not present

## 2021-05-14 DIAGNOSIS — Z8249 Family history of ischemic heart disease and other diseases of the circulatory system: Secondary | ICD-10-CM

## 2021-05-14 DIAGNOSIS — E876 Hypokalemia: Secondary | ICD-10-CM | POA: Diagnosis present

## 2021-05-14 DIAGNOSIS — R627 Adult failure to thrive: Secondary | ICD-10-CM | POA: Diagnosis present

## 2021-05-14 DIAGNOSIS — E785 Hyperlipidemia, unspecified: Secondary | ICD-10-CM | POA: Diagnosis present

## 2021-05-14 DIAGNOSIS — Z7982 Long term (current) use of aspirin: Secondary | ICD-10-CM

## 2021-05-14 DIAGNOSIS — G8929 Other chronic pain: Secondary | ICD-10-CM | POA: Diagnosis present

## 2021-05-14 DIAGNOSIS — Z7901 Long term (current) use of anticoagulants: Secondary | ICD-10-CM

## 2021-05-14 DIAGNOSIS — K5649 Other impaction of intestine: Secondary | ICD-10-CM | POA: Diagnosis not present

## 2021-05-14 DIAGNOSIS — E440 Moderate protein-calorie malnutrition: Secondary | ICD-10-CM | POA: Diagnosis not present

## 2021-05-14 DIAGNOSIS — R000 Tachycardia, unspecified: Secondary | ICD-10-CM | POA: Diagnosis not present

## 2021-05-14 DIAGNOSIS — R64 Cachexia: Secondary | ICD-10-CM | POA: Diagnosis present

## 2021-05-14 DIAGNOSIS — Z681 Body mass index (BMI) 19 or less, adult: Secondary | ICD-10-CM

## 2021-05-14 DIAGNOSIS — J918 Pleural effusion in other conditions classified elsewhere: Secondary | ICD-10-CM | POA: Diagnosis present

## 2021-05-14 DIAGNOSIS — J189 Pneumonia, unspecified organism: Secondary | ICD-10-CM | POA: Diagnosis present

## 2021-05-14 DIAGNOSIS — Z79899 Other long term (current) drug therapy: Secondary | ICD-10-CM

## 2021-05-14 DIAGNOSIS — M47819 Spondylosis without myelopathy or radiculopathy, site unspecified: Secondary | ICD-10-CM | POA: Diagnosis present

## 2021-05-14 DIAGNOSIS — R5381 Other malaise: Secondary | ICD-10-CM | POA: Diagnosis not present

## 2021-05-14 DIAGNOSIS — K5641 Fecal impaction: Secondary | ICD-10-CM | POA: Diagnosis not present

## 2021-05-14 DIAGNOSIS — Z85828 Personal history of other malignant neoplasm of skin: Secondary | ICD-10-CM

## 2021-05-14 DIAGNOSIS — R69 Illness, unspecified: Secondary | ICD-10-CM | POA: Diagnosis not present

## 2021-05-14 DIAGNOSIS — L89151 Pressure ulcer of sacral region, stage 1: Secondary | ICD-10-CM | POA: Diagnosis not present

## 2021-05-14 DIAGNOSIS — I509 Heart failure, unspecified: Secondary | ICD-10-CM | POA: Diagnosis not present

## 2021-05-14 DIAGNOSIS — M48061 Spinal stenosis, lumbar region without neurogenic claudication: Secondary | ICD-10-CM | POA: Diagnosis present

## 2021-05-14 DIAGNOSIS — M21371 Foot drop, right foot: Secondary | ICD-10-CM | POA: Diagnosis present

## 2021-05-14 DIAGNOSIS — Z7983 Long term (current) use of bisphosphonates: Secondary | ICD-10-CM

## 2021-05-14 DIAGNOSIS — J90 Pleural effusion, not elsewhere classified: Secondary | ICD-10-CM | POA: Diagnosis not present

## 2021-05-14 DIAGNOSIS — I480 Paroxysmal atrial fibrillation: Secondary | ICD-10-CM | POA: Diagnosis present

## 2021-05-14 DIAGNOSIS — Z743 Need for continuous supervision: Secondary | ICD-10-CM | POA: Diagnosis not present

## 2021-05-14 DIAGNOSIS — R0602 Shortness of breath: Secondary | ICD-10-CM | POA: Diagnosis not present

## 2021-05-14 DIAGNOSIS — F32A Depression, unspecified: Secondary | ICD-10-CM | POA: Diagnosis present

## 2021-05-14 DIAGNOSIS — R262 Difficulty in walking, not elsewhere classified: Secondary | ICD-10-CM | POA: Diagnosis not present

## 2021-05-14 DIAGNOSIS — D7589 Other specified diseases of blood and blood-forming organs: Secondary | ICD-10-CM | POA: Diagnosis present

## 2021-05-14 DIAGNOSIS — J9811 Atelectasis: Secondary | ICD-10-CM | POA: Diagnosis not present

## 2021-05-14 DIAGNOSIS — E875 Hyperkalemia: Secondary | ICD-10-CM | POA: Diagnosis not present

## 2021-05-14 DIAGNOSIS — E43 Unspecified severe protein-calorie malnutrition: Secondary | ICD-10-CM | POA: Insufficient documentation

## 2021-05-14 DIAGNOSIS — K5900 Constipation, unspecified: Secondary | ICD-10-CM

## 2021-05-14 DIAGNOSIS — Z880 Allergy status to penicillin: Secondary | ICD-10-CM

## 2021-05-14 DIAGNOSIS — I499 Cardiac arrhythmia, unspecified: Secondary | ICD-10-CM | POA: Diagnosis not present

## 2021-05-14 DIAGNOSIS — Z20822 Contact with and (suspected) exposure to covid-19: Secondary | ICD-10-CM | POA: Diagnosis present

## 2021-05-14 DIAGNOSIS — F419 Anxiety disorder, unspecified: Secondary | ICD-10-CM | POA: Diagnosis present

## 2021-05-14 DIAGNOSIS — R0902 Hypoxemia: Secondary | ICD-10-CM | POA: Diagnosis not present

## 2021-05-14 DIAGNOSIS — J9601 Acute respiratory failure with hypoxia: Secondary | ICD-10-CM | POA: Diagnosis present

## 2021-05-14 DIAGNOSIS — Z90710 Acquired absence of both cervix and uterus: Secondary | ICD-10-CM

## 2021-05-14 HISTORY — DX: Heart failure, unspecified: I50.9

## 2021-05-14 HISTORY — DX: Cardiac arrhythmia, unspecified: I49.9

## 2021-05-14 LAB — RESP PANEL BY RT-PCR (FLU A&B, COVID) ARPGX2
Influenza A by PCR: NEGATIVE
Influenza B by PCR: NEGATIVE
SARS Coronavirus 2 by RT PCR: NEGATIVE

## 2021-05-14 LAB — CBC WITH DIFFERENTIAL/PLATELET
Abs Immature Granulocytes: 0.03 10*3/uL (ref 0.00–0.07)
Basophils Absolute: 0.1 10*3/uL (ref 0.0–0.1)
Basophils Relative: 1 %
Eosinophils Absolute: 0 10*3/uL (ref 0.0–0.5)
Eosinophils Relative: 0 %
HCT: 48.2 % — ABNORMAL HIGH (ref 36.0–46.0)
Hemoglobin: 15.1 g/dL — ABNORMAL HIGH (ref 12.0–15.0)
Immature Granulocytes: 0 %
Lymphocytes Relative: 17 %
Lymphs Abs: 1.9 10*3/uL (ref 0.7–4.0)
MCH: 31.9 pg (ref 26.0–34.0)
MCHC: 31.3 g/dL (ref 30.0–36.0)
MCV: 101.7 fL — ABNORMAL HIGH (ref 80.0–100.0)
Monocytes Absolute: 1 10*3/uL (ref 0.1–1.0)
Monocytes Relative: 10 %
Neutro Abs: 7.8 10*3/uL — ABNORMAL HIGH (ref 1.7–7.7)
Neutrophils Relative %: 72 %
Platelets: 307 10*3/uL (ref 150–400)
RBC: 4.74 MIL/uL (ref 3.87–5.11)
RDW: 12.3 % (ref 11.5–15.5)
WBC: 10.8 10*3/uL — ABNORMAL HIGH (ref 4.0–10.5)
nRBC: 0 % (ref 0.0–0.2)

## 2021-05-14 LAB — BRAIN NATRIURETIC PEPTIDE: B Natriuretic Peptide: 212.1 pg/mL — ABNORMAL HIGH (ref 0.0–100.0)

## 2021-05-14 LAB — COMPREHENSIVE METABOLIC PANEL
ALT: 18 U/L (ref 0–44)
AST: 19 U/L (ref 15–41)
Albumin: 3.4 g/dL — ABNORMAL LOW (ref 3.5–5.0)
Alkaline Phosphatase: 28 U/L — ABNORMAL LOW (ref 38–126)
Anion gap: 11 (ref 5–15)
BUN: 10 mg/dL (ref 8–23)
CO2: 36 mmol/L — ABNORMAL HIGH (ref 22–32)
Calcium: 9.1 mg/dL (ref 8.9–10.3)
Chloride: 86 mmol/L — ABNORMAL LOW (ref 98–111)
Creatinine, Ser: 0.41 mg/dL — ABNORMAL LOW (ref 0.44–1.00)
GFR, Estimated: >60 mL/min (ref >60)
Glucose, Bld: 128 mg/dL — ABNORMAL HIGH (ref 70–99)
Potassium: 3.4 mmol/L — ABNORMAL LOW (ref 3.5–5.1)
Sodium: 133 mmol/L — ABNORMAL LOW (ref 135–145)
Total Bilirubin: 0.8 mg/dL (ref 0.3–1.2)
Total Protein: 5.6 g/dL — ABNORMAL LOW (ref 6.5–8.1)

## 2021-05-14 LAB — D-DIMER, QUANTITATIVE: D-Dimer, Quant: 0.59 ug/mL-FEU — ABNORMAL HIGH (ref 0.00–0.50)

## 2021-05-14 LAB — MAGNESIUM: Magnesium: 1.9 mg/dL (ref 1.7–2.4)

## 2021-05-14 LAB — TROPONIN I (HIGH SENSITIVITY): Troponin I (High Sensitivity): 8 ng/L (ref <18)

## 2021-05-14 MED ORDER — POTASSIUM CHLORIDE 10 MEQ/100ML IV SOLN
10.0000 meq | INTRAVENOUS | Status: AC
Start: 2021-05-15 — End: 2021-05-15
  Administered 2021-05-15 (×2): 10 meq via INTRAVENOUS
  Filled 2021-05-14 (×2): qty 100

## 2021-05-14 MED ORDER — FUROSEMIDE 10 MG/ML IJ SOLN
20.0000 mg | Freq: Once | INTRAMUSCULAR | Status: AC
  Administered 2021-05-14: 20 mg via INTRAVENOUS
  Filled 2021-05-14: qty 2

## 2021-05-14 NOTE — ED Notes (Signed)
Pt placed on purewick 

## 2021-05-14 NOTE — ED Provider Notes (Signed)
[facility] EMERGENCY DEPARTMENT Provider Note   CSN: [id number] Arrival date & time: 05/14/21  1146     History Chief Complaint  Patient presents with   Weakness    Mary Carson is a 75 y.o. female.   Weakness  74yoF pmhx htn, hld, depression, FTT (decreased appetite and weight over last several months, poss d/t abdominal discomfort, negative gi w/u so far), discharged from [place] last week w/ uti (levofloxacin '500mg'$  qd), afib (started elqiuis '5mg'$  bid, lopressor '25mg'$  bid), hypo-k+, p/w worsening sob and desat to 84% during pt visit this am.  Gradual onset, mild severity chest discomfort, mid-sternal, non-radiating, wax/wane, no modifying factors or triggers.  No other associated symptoms.  Originally presented to [facility] for "deep rattley cough w/ sob" with negative imaging and swallow studies. Seen by Dr. Melina Copa (GI, [place]), Dr. Christene Slates (pulm, [place]). Had home-health set up post-discharge, x2/wk, family has been staying w/ her 24/7.  No further medical concerns at this time including fevers, chills, diaphoresis, sore throat, rhinorrhea, palpitations, abdominal pain, N/V, bowel/bladder changes, headache, visual change, focal weakness/paresthesias.  History obtained from patient, grandson, chart review  Past Medical History:  Diagnosis Date   Anxiety    CHF (congestive heart failure) ([place])    Dysrhythmia    RMSF (Rocky Mountain spotted fever)    Squamous cell carcinoma of scalp    Vitamin D deficiency    Weakness     Patient Active Problem List   Diagnosis Date Noted   Acute CHF (congestive heart failure) ([place]) 05/14/2021   PAF (paroxysmal atrial fibrillation) ([place]) 05/14/2021   Cough 12/07/2020   Allergic rhinitis due to allergen 11/07/2020   Benign hypertension 09/13/2020   Malnutrition of mild degree ([place]) 08/18/2020   Neck pain 08/18/2020   Chronic bilateral low back pain without sciatica 08/18/2020   Weight loss 06/19/2020   Mixed  hyperlipidemia 06/19/2020   Routine medical exam 05/16/2020   Dermatitis 01/31/2020   Gastritis 01/18/2020   Lethargy 01/04/2020   Vitamin D deficiency 11/24/2019   Anxiety 11/24/2019   Weakness 11/24/2019   [place] spotted fever 11/24/2019    Past Surgical History:  Procedure Laterality Date   ABDOMINAL HYSTERECTOMY       OB History   No obstetric history on file.     Family History  Problem Relation Age of Onset   CAD Other     Social History   Tobacco Use   Smoking status: Never   Smokeless tobacco: Never  Vaping Use   Vaping Use: Never used  Substance Use Topics   Alcohol use: Never   Drug use: Never    Home Medications Prior to Admission medications   Medication Sig Start Date End Date Taking? Authorizing Provider  acetaminophen (TYLENOL) 325 MG tablet Take by mouth.    [provider]  alendronate (FOSAMAX) 70 MG tablet Take 70 mg by mouth once a week. 04/22/21   [provider]  aspirin 81 MG EC tablet Take by mouth.    [provider]  aspirin 81 MG EC tablet Take by mouth.    [provider]  benzonatate (TESSALON) 100 MG capsule Take 1 capsule by mouth three times daily as needed for cough 12/11/20   Marge Duncans, PA-C  Cholecalciferol 25 MCG (1000 UT) tablet Take by mouth.    [provider]  citalopram (CELEXA) 20 MG tablet Take 1 tablet by mouth once daily 11/21/20   Rip Harbour, NP  ELIQUIS 5  MG TABS tablet Take 5 mg by mouth 2 (two) times daily. 05/10/21   [provider]  lisinopril (ZESTRIL) 5 MG tablet Take 1 tablet (5 mg total) by mouth daily. 10/12/20   Marge Duncans, PA-C  LORazepam (ATIVAN) 1 MG tablet Take 1 tablet (1 mg total) by mouth at bedtime. 09/13/20   Marge Duncans, PA-C  megestrol (MEGACE) 20 MG tablet Take 1 tablet (20 mg total) by mouth daily. 01/04/21   Marge Duncans, PA-C  Omega-3 Fatty Acids (FISH OIL) 1000 MG CAPS Take by mouth.    [provider]  pantoprazole  (PROTONIX) 40 MG tablet Take 40 mg by mouth daily. 06/15/20   [provider]  polyethylene glycol powder (GLYCOLAX/MIRALAX) 17 GM/SCOOP powder SMARTSIG:1 Capful(s) By Mouth Twice Daily 03/16/20   [provider]    Allergies    Augmentin [amoxicillin-pot clavulanate] and Penicillins  Review of Systems   Review of Systems  Neurological:  Positive for weakness.  All other systems reviewed and are negative.  Physical Exam Updated Vital Signs BP 106/65   Pulse (!) 115   Temp 98 F (36.7 C) (Oral)   Resp (!) 24   Ht '5\' 5"'$  (1.651 m)   Wt 36.7 kg   SpO2 99%   BMI 13.48 kg/m   Physical Exam Vitals and nursing note reviewed.  Constitutional:      General: She is not in acute distress.    Appearance: She is not toxic-appearing.     Comments: Malnourished  HENT:     Head: Normocephalic and atraumatic.     Mouth/Throat:     Mouth: Mucous membranes are moist.     Pharynx: Oropharynx is clear.  Eyes:     Extraocular Movements: Extraocular movements intact.     Conjunctiva/sclera: Conjunctivae normal.     Pupils: Pupils are equal, round, and reactive to light.  Cardiovascular:     Rate and Rhythm: Tachycardia present. Rhythm irregular.     Heart sounds: Murmur heard.    No friction rub. No gallop.  Pulmonary:     Effort: Pulmonary effort is normal.     Breath sounds: No stridor. Wheezing present. No rhonchi or rales.  Abdominal:     General: There is no distension.     Palpations: Abdomen is soft.     Tenderness: There is no abdominal tenderness. There is no guarding or rebound.  Musculoskeletal:        General: No deformity or signs of injury.     Cervical back: Normal range of motion and neck supple. No rigidity.     Comments: Scant pitting edema bilaterally  Neurological:     General: No focal deficit present.     Mental Status: She is alert and oriented to person, place, and time.  Psychiatric:        Mood and Affect: Mood normal.        Behavior:  Behavior normal.    ED Results / Procedures / Treatments   Labs (all labs ordered are listed, but only abnormal results are displayed) Labs Reviewed  CBC WITH DIFFERENTIAL/PLATELET - Abnormal; Notable for the following components:      Result Value   WBC 10.8 (*)    Hemoglobin 15.1 (*)    HCT 48.2 (*)    MCV 101.7 (*)    Neutro Abs 7.8 (*)    All other components within normal limits  COMPREHENSIVE METABOLIC PANEL - Abnormal; Notable for the following components:   Sodium 133 (*)  Potassium 3.4 (*)    Chloride 86 (*)    CO2 36 (*)    Glucose, Bld 128 (*)    Creatinine, Ser 0.41 (*)    Total Protein 5.6 (*)    Albumin 3.4 (*)    Alkaline Phosphatase 28 (*)    All other components within normal limits  BRAIN NATRIURETIC PEPTIDE - Abnormal; Notable for the following components:   B Natriuretic Peptide 212.1 (*)    All other components within normal limits  D-DIMER, QUANTITATIVE - Abnormal; Notable for the following components:   D-Dimer, Quant 0.59 (*)    All other components within normal limits  RESP PANEL BY RT-PCR (FLU A&B, COVID) ARPGX2  MAGNESIUM  URINALYSIS, ROUTINE W REFLEX MICROSCOPIC  TSH  BASIC METABOLIC PANEL  CBC  TROPONIN I (HIGH SENSITIVITY)    EKG EKG Interpretation  Date/Time:  Monday May 14 2021 13:09:29 EDT Ventricular Rate:  124 PR Interval:  140 QRS Duration: 68 QT Interval:  330 QTC Calculation: 474 R Axis:   55 Text Interpretation: Undetermined rhythm Septal infarct , age undetermined Abnormal ECG Probable NSR with frequent PVC and PACs, no previous Confirmed by Lavenia Atlas [phone]) on 05/14/2021 6:20:43 PM  Radiology DG Chest 2 View  Result Date: 05/14/2021 CLINICAL DATA:  Shortness of breath, hypoxia. EXAM: CHEST - 2 VIEW COMPARISON:  May 09, 2021. FINDINGS: The heart size and mediastinal contours are within normal limits. No pneumothorax is noted. Minimal bibasilar subsegmental atelectasis is noted with small pleural effusions. The  visualized skeletal structures are unremarkable. IMPRESSION: Minimal bibasilar subsegmental atelectasis with small pleural effusions. Electronically Signed   By: Marijo Conception M.D.   On: 05/14/2021 15:28    Procedures Procedures   Medications Ordered in ED Medications  citalopram (CELEXA) tablet 20 mg (has no administration in time range)  LORazepam (ATIVAN) tablet 1 mg (1 mg Oral Given 05/15/21 0030)  megestrol (MEGACE) tablet 20 mg (has no administration in time range)  pantoprazole (PROTONIX) EC tablet 40 mg (has no administration in time range)  metoprolol tartrate (LOPRESSOR) tablet 25 mg (25 mg Oral Given 05/15/21 0030)  apixaban (ELIQUIS) tablet 5 mg (5 mg Oral Given 05/15/21 0030)  furosemide (LASIX) injection 20 mg (20 mg Intravenous Given 05/14/21 2211)  potassium chloride 10 mEq in 100 mL IVPB (10 mEq Intravenous New Bag/Given 05/15/21 0128)    ED Course  I have reviewed the triage vital signs and the nursing notes.  Pertinent labs & imaging results that were available during my care of the patient were reviewed by me and considered in my medical decision making (see chart for details).    MDM Rules/Calculators/A&P                          This is a 75 year old female PMHx htn, hld, depression, FTT (decreased appetite and weight over last several months, poss d/t abdominal discomfort, negative gi w/u so far), discharged from [place] last week w/ uti (levofloxacin '500mg'$  qd), afib (started elqiuis '5mg'$  bid, lopressor '25mg'$  bid), p/w worsening sob and desat to 84% during pt visit this am.  On exam, mildly tachycardic irregularly, saturating well on 2 LPM, slight scattered wheezes present, mild pitting edema.  All studies independently reviewed by myself, d/w the attending physician, factored into my MDM. -EKG: Sinus tachycardia 124 bpm, with multiple PACs and PVC x1, normal axis, normal intervals, no acute ST-T changes, no prior for comparison -CXR: Minimal bibasilar subsegmental  atelectasis  with small pleural effusions -CBCd: WBC 10.8 -Dimer: 0.59 -BNP: 212.1 -Unremarkable: CMP, Mg++, troponin x1, COVID/influenza PCR  Presentation appears most consistent with mild congestive heart failure.  EKG without concerning ischemic changes and normal troponin x1.  No tearing or posteriorly radiating chest pain, pulse deficits, or FND.  Dimer within age-appropriate cutoff, although patient does have new O2 requirement and tachycardia, consider further testing for PE rule out if interventions for CHF exacerbation did not improve symptoms.  No systemic infectious symptoms, pleasant lung sounds to suggest pneumonia.  Imaging without evidence of PTX or pneumoperitoneum.  No overlying rash.  Unlikely MSK chest pain.  Given her failure to thrive, recent diagnosis of A. fib, multiple PACs on EKG, as well as her CHF exacerbation, feel the patient requires admission.  Discussed with hospital service who agreed admit.  Discussed with family and patient who understand and agree with plan.  Patient HDS on reevaluation and care transferred to admitting service.  Final Clinical Impression(s) / ED Diagnoses Final diagnoses:  None    Rx / DC Orders ED Discharge Orders     None        Levin Bacon, MD 05/15/21 0239    Lorelle Gibbs, DO 05/15/21 2238

## 2021-05-14 NOTE — ED Provider Notes (Addendum)
Emergency Medicine Provider Triage Evaluation Note  Mary Carson , a 75 y.o. female  was evaluated in triage.  Pt complains of weakness, hypoxia.  Patient was recently discharged from the hospital in [place] for weakness, has been receiving PT and OT.  Family states that she is been doing well, however PT came up today and noticed that she was hypoxic to 87% on room air, has never had this happen her before.  Denies any history of COPD.  Patient states that she has been losing weight and feels weak, 3 L of oxygen given given to 98%.  Patient does have history of a flutter, patient on Eliquis.  Has been compliant with it. Review of Systems  Positive: Shortness breath, weakness Negative: Fevers  Physical Exam  BP (!) 160/105   Pulse (!) 124   Temp 97.9 F (36.6 C) (Oral)   Resp 18   SpO2 100%  Gen:   Patient appears chronically ill, cachectic Resp:  On 3 L MSK:   Moves extremities without difficulty  Other:  Irregular rhythm, with labile pulse, tachycardic, last rate normal  Medical Decision Making  Medically screening exam initiated at 1:23 PM.  Appropriate orders placed.  NEYAH MOTZ was informed that the remainder of the evaluation will be completed by another provider, this initial triage assessment does not replace that evaluation, and the importance of remaining in the ED until their evaluation is complete.   Patient is on a blood thinner, will refrain from ordering CT PE study until patient is seen.  Patient is not in any respiratory distress currently.    Alfredia Client, PA-C 05/14/21 1327    Daleen Bo, MD 05/15/21 2239

## 2021-05-14 NOTE — ED Triage Notes (Signed)
Pt from home with ems for generalized weakness and hypoxia. Pt was recently discharged from the hospital with a UTI and hypo K. Pt was 87% on room air at home during PT, pt given 3L [place] and increased to 98%. Pt alert, c.o feeling weak all over.

## 2021-05-15 ENCOUNTER — Observation Stay (HOSPITAL_COMMUNITY): Payer: Medicare HMO

## 2021-05-15 DIAGNOSIS — K5900 Constipation, unspecified: Secondary | ICD-10-CM | POA: Diagnosis not present

## 2021-05-15 DIAGNOSIS — R0602 Shortness of breath: Secondary | ICD-10-CM | POA: Diagnosis not present

## 2021-05-15 DIAGNOSIS — G319 Degenerative disease of nervous system, unspecified: Secondary | ICD-10-CM | POA: Diagnosis not present

## 2021-05-15 DIAGNOSIS — I700 Atherosclerosis of aorta: Secondary | ICD-10-CM | POA: Diagnosis not present

## 2021-05-15 DIAGNOSIS — R29818 Other symptoms and signs involving the nervous system: Secondary | ICD-10-CM | POA: Diagnosis not present

## 2021-05-15 DIAGNOSIS — M5136 Other intervertebral disc degeneration, lumbar region: Secondary | ICD-10-CM | POA: Diagnosis not present

## 2021-05-15 DIAGNOSIS — R079 Chest pain, unspecified: Secondary | ICD-10-CM | POA: Diagnosis not present

## 2021-05-15 DIAGNOSIS — J918 Pleural effusion in other conditions classified elsewhere: Secondary | ICD-10-CM | POA: Diagnosis not present

## 2021-05-15 DIAGNOSIS — F419 Anxiety disorder, unspecified: Secondary | ICD-10-CM | POA: Diagnosis present

## 2021-05-15 DIAGNOSIS — I480 Paroxysmal atrial fibrillation: Secondary | ICD-10-CM | POA: Diagnosis present

## 2021-05-15 DIAGNOSIS — Z90710 Acquired absence of both cervix and uterus: Secondary | ICD-10-CM | POA: Diagnosis not present

## 2021-05-15 DIAGNOSIS — I509 Heart failure, unspecified: Secondary | ICD-10-CM | POA: Diagnosis not present

## 2021-05-15 DIAGNOSIS — J90 Pleural effusion, not elsewhere classified: Secondary | ICD-10-CM | POA: Diagnosis not present

## 2021-05-15 DIAGNOSIS — Z8249 Family history of ischemic heart disease and other diseases of the circulatory system: Secondary | ICD-10-CM | POA: Diagnosis not present

## 2021-05-15 DIAGNOSIS — J189 Pneumonia, unspecified organism: Secondary | ICD-10-CM | POA: Diagnosis not present

## 2021-05-15 DIAGNOSIS — R627 Adult failure to thrive: Secondary | ICD-10-CM | POA: Diagnosis present

## 2021-05-15 DIAGNOSIS — E785 Hyperlipidemia, unspecified: Secondary | ICD-10-CM | POA: Diagnosis present

## 2021-05-15 DIAGNOSIS — Z681 Body mass index (BMI) 19 or less, adult: Secondary | ICD-10-CM | POA: Diagnosis not present

## 2021-05-15 DIAGNOSIS — Z7982 Long term (current) use of aspirin: Secondary | ICD-10-CM | POA: Diagnosis not present

## 2021-05-15 DIAGNOSIS — Z85828 Personal history of other malignant neoplasm of skin: Secondary | ICD-10-CM | POA: Diagnosis not present

## 2021-05-15 DIAGNOSIS — I5033 Acute on chronic diastolic (congestive) heart failure: Secondary | ICD-10-CM | POA: Diagnosis not present

## 2021-05-15 DIAGNOSIS — I5030 Unspecified diastolic (congestive) heart failure: Secondary | ICD-10-CM | POA: Diagnosis not present

## 2021-05-15 DIAGNOSIS — R64 Cachexia: Secondary | ICD-10-CM | POA: Diagnosis not present

## 2021-05-15 DIAGNOSIS — K5649 Other impaction of intestine: Secondary | ICD-10-CM | POA: Diagnosis not present

## 2021-05-15 DIAGNOSIS — E43 Unspecified severe protein-calorie malnutrition: Secondary | ICD-10-CM | POA: Diagnosis not present

## 2021-05-15 DIAGNOSIS — I110 Hypertensive heart disease with heart failure: Secondary | ICD-10-CM | POA: Diagnosis not present

## 2021-05-15 DIAGNOSIS — M48061 Spinal stenosis, lumbar region without neurogenic claudication: Secondary | ICD-10-CM | POA: Diagnosis not present

## 2021-05-15 DIAGNOSIS — J9601 Acute respiratory failure with hypoxia: Secondary | ICD-10-CM | POA: Diagnosis not present

## 2021-05-15 DIAGNOSIS — F32A Depression, unspecified: Secondary | ICD-10-CM | POA: Diagnosis present

## 2021-05-15 DIAGNOSIS — M5127 Other intervertebral disc displacement, lumbosacral region: Secondary | ICD-10-CM | POA: Diagnosis not present

## 2021-05-15 DIAGNOSIS — I5021 Acute systolic (congestive) heart failure: Secondary | ICD-10-CM | POA: Diagnosis not present

## 2021-05-15 DIAGNOSIS — Z880 Allergy status to penicillin: Secondary | ICD-10-CM | POA: Diagnosis not present

## 2021-05-15 DIAGNOSIS — J9811 Atelectasis: Secondary | ICD-10-CM | POA: Diagnosis not present

## 2021-05-15 DIAGNOSIS — I493 Ventricular premature depolarization: Secondary | ICD-10-CM | POA: Diagnosis present

## 2021-05-15 DIAGNOSIS — M5126 Other intervertebral disc displacement, lumbar region: Secondary | ICD-10-CM | POA: Diagnosis not present

## 2021-05-15 DIAGNOSIS — Z79899 Other long term (current) drug therapy: Secondary | ICD-10-CM | POA: Diagnosis not present

## 2021-05-15 DIAGNOSIS — J019 Acute sinusitis, unspecified: Secondary | ICD-10-CM | POA: Diagnosis not present

## 2021-05-15 DIAGNOSIS — M5137 Other intervertebral disc degeneration, lumbosacral region: Secondary | ICD-10-CM | POA: Diagnosis not present

## 2021-05-15 DIAGNOSIS — R634 Abnormal weight loss: Secondary | ICD-10-CM | POA: Diagnosis not present

## 2021-05-15 DIAGNOSIS — R54 Age-related physical debility: Secondary | ICD-10-CM | POA: Diagnosis present

## 2021-05-15 DIAGNOSIS — Z66 Do not resuscitate: Secondary | ICD-10-CM | POA: Diagnosis not present

## 2021-05-15 DIAGNOSIS — Z20822 Contact with and (suspected) exposure to covid-19: Secondary | ICD-10-CM | POA: Diagnosis not present

## 2021-05-15 LAB — BASIC METABOLIC PANEL
Anion gap: 4 — ABNORMAL LOW (ref 5–15)
BUN: 9 mg/dL (ref 8–23)
CO2: 35 mmol/L — ABNORMAL HIGH (ref 22–32)
Calcium: 6.6 mg/dL — ABNORMAL LOW (ref 8.9–10.3)
Chloride: 96 mmol/L — ABNORMAL LOW (ref 98–111)
Creatinine, Ser: 0.33 mg/dL — ABNORMAL LOW (ref 0.44–1.00)
GFR, Estimated: >60 mL/min (ref >60)
Glucose, Bld: 92 mg/dL (ref 70–99)
Potassium: 3.1 mmol/L — ABNORMAL LOW (ref 3.5–5.1)
Sodium: 135 mmol/L (ref 135–145)

## 2021-05-15 LAB — ECHOCARDIOGRAM COMPLETE
Area-P 1/2: 2.99 cm2
Height: 65 in
S' Lateral: 2.7 cm
Weight: 1296 oz

## 2021-05-15 LAB — CBC
HCT: 35.4 % — ABNORMAL LOW (ref 36.0–46.0)
Hemoglobin: 11.1 g/dL — ABNORMAL LOW (ref 12.0–15.0)
MCH: 32.7 pg (ref 26.0–34.0)
MCHC: 31.4 g/dL (ref 30.0–36.0)
MCV: 104.4 fL — ABNORMAL HIGH (ref 80.0–100.0)
Platelets: 219 10*3/uL (ref 150–400)
RBC: 3.39 MIL/uL — ABNORMAL LOW (ref 3.87–5.11)
RDW: 12.4 % (ref 11.5–15.5)
WBC: 6.7 10*3/uL (ref 4.0–10.5)
nRBC: 0 % (ref 0.0–0.2)

## 2021-05-15 LAB — URINALYSIS, ROUTINE W REFLEX MICROSCOPIC
Bilirubin Urine: NEGATIVE
Glucose, UA: NEGATIVE mg/dL
Hgb urine dipstick: NEGATIVE
Ketones, ur: NEGATIVE mg/dL
Nitrite: NEGATIVE
Protein, ur: NEGATIVE mg/dL
Specific Gravity, Urine: 1.01 (ref 1.005–1.030)
pH: 6 (ref 5.0–8.0)

## 2021-05-15 LAB — VITAMIN B12: Vitamin B-12: 400 pg/mL (ref 180–914)

## 2021-05-15 LAB — FOLATE: Folate: 11 ng/mL (ref >5.9)

## 2021-05-15 LAB — TSH: TSH: 2.092 u[IU]/mL (ref 0.350–4.500)

## 2021-05-15 MED ORDER — ACETAMINOPHEN 500 MG PO TABS
500.0000 mg | ORAL_TABLET | Freq: Four times a day (QID) | ORAL | Status: DC | PRN
  Administered 2021-05-15: 500 mg via ORAL
  Filled 2021-05-15: qty 1

## 2021-05-15 MED ORDER — POTASSIUM CHLORIDE 10 MEQ/100ML IV SOLN
10.0000 meq | INTRAVENOUS | Status: AC
  Administered 2021-05-15: 10 meq via INTRAVENOUS
  Filled 2021-05-15 (×2): qty 100

## 2021-05-15 MED ORDER — LORAZEPAM 1 MG PO TABS
1.0000 mg | ORAL_TABLET | Freq: Every day | ORAL | Status: DC
  Administered 2021-05-15: 1 mg via ORAL
  Filled 2021-05-15: qty 1

## 2021-05-15 MED ORDER — LORAZEPAM 0.5 MG PO TABS
0.5000 mg | ORAL_TABLET | Freq: Every day | ORAL | Status: DC
  Administered 2021-05-15 – 2021-05-21 (×7): 0.5 mg via ORAL
  Filled 2021-05-15 (×7): qty 1

## 2021-05-15 MED ORDER — POTASSIUM CHLORIDE 10 MEQ/100ML IV SOLN
10.0000 meq | Freq: Once | INTRAVENOUS | Status: AC
  Administered 2021-05-15: 10 meq via INTRAVENOUS

## 2021-05-15 MED ORDER — METOPROLOL TARTRATE 25 MG PO TABS
25.0000 mg | ORAL_TABLET | Freq: Two times a day (BID) | ORAL | Status: DC
  Administered 2021-05-15 – 2021-05-22 (×15): 25 mg via ORAL
  Filled 2021-05-15 (×15): qty 1

## 2021-05-15 MED ORDER — POTASSIUM CHLORIDE CRYS ER 20 MEQ PO TBCR
20.0000 meq | EXTENDED_RELEASE_TABLET | ORAL | Status: DC
  Administered 2021-05-15: 20 meq via ORAL
  Filled 2021-05-15: qty 1

## 2021-05-15 MED ORDER — PANTOPRAZOLE SODIUM 40 MG PO TBEC
40.0000 mg | DELAYED_RELEASE_TABLET | Freq: Every day | ORAL | Status: DC
  Administered 2021-05-15 – 2021-05-22 (×8): 40 mg via ORAL
  Filled 2021-05-15 (×8): qty 1

## 2021-05-15 MED ORDER — ENSURE ENLIVE PO LIQD
237.0000 mL | Freq: Two times a day (BID) | ORAL | Status: DC
  Administered 2021-05-17 – 2021-05-22 (×11): 237 mL via ORAL

## 2021-05-15 MED ORDER — MEGESTROL ACETATE 20 MG PO TABS
20.0000 mg | ORAL_TABLET | Freq: Every day | ORAL | Status: DC
  Filled 2021-05-15: qty 1

## 2021-05-15 MED ORDER — MEGESTROL ACETATE 400 MG/10ML PO SUSP
200.0000 mg | Freq: Two times a day (BID) | ORAL | Status: DC
  Administered 2021-05-15 – 2021-05-22 (×14): 200 mg via ORAL
  Filled 2021-05-15: qty 10
  Filled 2021-05-15 (×2): qty 5
  Filled 2021-05-15 (×11): qty 10
  Filled 2021-05-15: qty 5
  Filled 2021-05-15 (×3): qty 10

## 2021-05-15 MED ORDER — PROSOURCE PLUS PO LIQD
30.0000 mL | Freq: Two times a day (BID) | ORAL | Status: DC
  Administered 2021-05-15 – 2021-05-16 (×3): 30 mL via ORAL
  Filled 2021-05-15 (×4): qty 30

## 2021-05-15 MED ORDER — CITALOPRAM HYDROBROMIDE 20 MG PO TABS
20.0000 mg | ORAL_TABLET | Freq: Every day | ORAL | Status: DC
  Administered 2021-05-15 – 2021-05-22 (×8): 20 mg via ORAL
  Filled 2021-05-15 (×7): qty 1
  Filled 2021-05-15: qty 2

## 2021-05-15 MED ORDER — FUROSEMIDE 10 MG/ML IJ SOLN
40.0000 mg | Freq: Once | INTRAMUSCULAR | Status: AC
  Administered 2021-05-15: 40 mg via INTRAVENOUS
  Filled 2021-05-15: qty 4

## 2021-05-15 MED ORDER — POTASSIUM CHLORIDE CRYS ER 20 MEQ PO TBCR
40.0000 meq | EXTENDED_RELEASE_TABLET | Freq: Once | ORAL | Status: AC
  Administered 2021-05-15: 40 meq via ORAL
  Filled 2021-05-15: qty 2

## 2021-05-15 MED ORDER — APIXABAN 5 MG PO TABS
5.0000 mg | ORAL_TABLET | Freq: Two times a day (BID) | ORAL | Status: DC
  Administered 2021-05-15 – 2021-05-22 (×16): 5 mg via ORAL
  Filled 2021-05-15 (×16): qty 1

## 2021-05-15 MED ORDER — SIMETHICONE 40 MG/0.6ML PO SUSP
40.0000 mg | Freq: Four times a day (QID) | ORAL | Status: DC | PRN
  Administered 2021-05-15 – 2021-05-21 (×6): 40 mg via ORAL
  Filled 2021-05-15 (×10): qty 0.6

## 2021-05-15 MED ORDER — IOHEXOL 350 MG/ML SOLN
75.0000 mL | Freq: Once | INTRAVENOUS | Status: AC | PRN
  Administered 2021-05-15: 75 mL via INTRAVENOUS

## 2021-05-15 MED ORDER — ONDANSETRON HCL 4 MG/2ML IJ SOLN
4.0000 mg | Freq: Four times a day (QID) | INTRAMUSCULAR | Status: DC | PRN
Start: 2021-05-15 — End: 2021-05-23

## 2021-05-15 NOTE — ED Notes (Signed)
Full bed changed performs. Brief changed. Pt sitting up, family at bedside. Family encouraging pt to eat breakfast.  Red area noted on sacrum. Stage 1. Skin intact.

## 2021-05-15 NOTE — ED Notes (Signed)
Patient resting quietly, denies any pain at this time. Grandson at bedside, updated to plan of care. No needs expressed at this time.

## 2021-05-15 NOTE — Progress Notes (Signed)
  Echocardiogram 2D Echocardiogram has been performed.  Mary Carson 05/15/2021, 9:10 AM

## 2021-05-15 NOTE — ED Notes (Signed)
Breakfast Order Placed ?

## 2021-05-15 NOTE — Progress Notes (Signed)
PROGRESS NOTE                                                                                                                                                                                                             Patient Demographics:    Mary Carson, is a 75 y.o. female, DOB - Sep 20, 1946, [id number]  Outpatient Primary MD for the patient is Georganna Skeans, PA-C    LOS - 0  Admit date - 05/14/2021    Chief Complaint  Patient presents with   Weakness       Brief Narrative (HPI from H&P)    Mary Carson is a 75 y.o. female with history of hypertension recently diagnosed atrial fibrillation and UTI at [facility] who was at home and being seen by home health, when home health staff came by yesterday they found her to be short of breath and hypoxic, she was brought to the ER where she was found to have acute on chronic diastolic CHF along with ongoing severe cachexia and unintentional weight loss.   Subjective:    Mary Carson today has, No headache, No chest pain, No abdominal pain - No Nausea, No new weakness tingling or numbness, mild shortness of breath.   Assessment  & Plan :     Acute Hypoxic Resp. Failure due to Acute On Chronic Diastolic CHF EF [id number]-  likely brought on by new onset A. Fib -she is being gently diuresed with Lasix, continue beta-blocker for rate control, not enough fluid for thoracentesis, already better with diuresis and supplemental oxygen continue.  2.  New diagnosis of paroxysmal A. fib.  Mali vas 2 score of 3.  Continue Coreg and Eliquis and monitor.  Goal will be rate control.  Echocardiogram noted with preserved EF, TSH stable.  Outpatient cardiology follow-up  3.  Hypokalemia.  Replaced.  4. Severe Cachexia with 70 pound unintentional weight loss and Moderate PCM - megace + protein supplements.  Outpatient weight loss work-up directed by PCP.  5.  Anxiety  depression.  Continue home medications Celexa, drop Ativan dose to avoid delirium.      Condition - Extremely Guarded  Family Communication  :    Mosetta Anis [phone] - 05/15/21  Adonis Brook [phone] - 05/15/21   Code Status :  DNR - DW son and Grandson EMT on  05/15/21  Consults  :  None  PUD Prophylaxis :     Procedures  :     CTA - 1. No CT findings for pulmonary embolism. 2. Stable tortuosity, ectasia and scattered calcification of the thoracic aorta. 3. Small bilateral pleural effusions with overlying atelectasis. 4. Patchy nodular ground-glass airspace opacities in both lower lobes could suggest early infiltrates or inflammation. 5. Marked cachexia. 6. Aortic atherosclerosis. Aortic Atherosclerosis  TTE - 1. Left ventricular ejection fraction, by estimation, is 60 to 65%. The left ventricle has normal function. The left ventricle has no regional wall motion abnormalities. Left ventricular diastolic parameters are consistent with Grade I diastolic dysfunction (impaired relaxation).  2. Right ventricular systolic function is normal. The right ventricular size is normal.  3. The mitral valve is normal in structure. Trivial mitral valve regurgitation. No evidence of mitral stenosis.  4. The aortic valve is tricuspid. There is mild calcification of the aortic valve. Aortic valve regurgitation is not visualized. No aortic stenosis is present.      Disposition Plan  :    Dispo: The patient is from: Home              Anticipated d/c is to: Home              Patient currently is not medically stable to d/c.   Difficult to place patient No   DVT Prophylaxis  :    apixaban (ELIQUIS) tablet 5 mg     Lab Results  Component Value Date   PLT 219 05/15/2021    Diet :  Diet Order             Diet Heart Room service appropriate? Yes; Fluid consistency: Thin; Fluid restriction: 1200 mL Fluid  Diet effective now                    Inpatient Medications   apixaban   5 mg Oral BID   citalopram  20 mg Oral Daily   LORazepam  1 mg Oral QHS   megestrol  200 mg Oral BID   metoprolol tartrate  25 mg Oral BID   pantoprazole  40 mg Oral Daily   potassium chloride  20 mEq Oral Q4H   Continuous Infusions: PRN Meds:.  Antibiotics  :    Anti-infectives (From admission, onward)    None        Time Spent in minutes  30   Lala Lund M.D on 05/15/2021 at 1:01 PM  To page go to [email or web address]   [facility] -  Office  [phone]    See all Orders from today for further details    Objective:   Vitals:   05/15/21 0800 05/15/21 0830 05/15/21 1100 05/15/21 1130  BP: 122/66 128/77 106/61 99/68  Pulse: 66 92 81 70  Resp: '14 15 17 14  '$ Temp:      TempSrc:      SpO2: 98% 100% 100% 99%  Weight:      Height:        Wt Readings from Last 3 Encounters:  05/15/21 36.7 kg  01/04/21 43.7 kg  12/07/20 45.8 kg     Intake/Output Summary (Last 24 hours) at 05/15/2021 1301 Last data filed at 05/15/2021 0313 Gross per 24 hour  Intake 100 ml  Output --  Net 100 ml     Physical Exam  Awake Alert, No new F.N deficits, Normal affect [place].AT,PERRAL Supple Neck,No JVD, No cervical lymphadenopathy appriciated.  Symmetrical Chest  wall movement, Good air movement bilaterally, few rales RRR,No Gallops,Rubs or new Murmurs, No Parasternal Heave +ve B.Sounds, Abd Soft, No tenderness, No organomegaly appriciated, No rebound - guarding or rigidity. No Cyanosis, Clubbing or edema, No new Rash or bruise       Data Review:    CBC Recent Labs  Lab 05/14/21 1324 05/15/21 0307  WBC 10.8* 6.7  HGB 15.1* 11.1*  HCT 48.2* 35.4*  PLT 307 219  MCV 101.7* 104.4*  MCH 31.9 32.7  MCHC 31.3 31.4  RDW 12.3 12.4  LYMPHSABS 1.9  --   MONOABS 1.0  --   EOSABS 0.0  --   BASOSABS 0.1  --     Recent Labs  Lab 05/14/21 1324 05/14/21 1340 05/14/21 1813 05/15/21 0307  NA 133*  --   --  135  K 3.4*  --   --  3.1*  CL 86*  --   --  96*  CO2 36*  --    --  35*  GLUCOSE 128*  --   --  92  BUN 10  --   --  9  CREATININE 0.41*  --   --  0.33*  CALCIUM 9.1  --   --  6.6*  AST 19  --   --   --   ALT 18  --   --   --   ALKPHOS 28*  --   --   --   BILITOT 0.8  --   --   --   ALBUMIN 3.4*  --   --   --   MG 1.9  --   --   --   DDIMER  --  0.59*  --   --   TSH  --   --   --  2.092  BNP  --   --  212.1*  --     ------------------------------------------------------------------------------------------------------------------ No results for input(s): CHOL, HDL, LDLCALC, TRIG, CHOLHDL, LDLDIRECT in the last 72 hours.  No results found for: HGBA1C ------------------------------------------------------------------------------------------------------------------ Recent Labs    05/15/21 0307  TSH 2.092    Cardiac Enzymes No results for input(s): CKMB, TROPONINI, MYOGLOBIN in the last 168 hours.  Invalid input(s): CK ------------------------------------------------------------------------------------------------------------------    Component Value Date/Time   BNP 212.1 (H) 05/14/2021 1813    Micro Results  Radiology Reports DG Chest 2 View  Result Date: 05/14/2021 CLINICAL DATA:  Shortness of breath, hypoxia. EXAM: CHEST - 2 VIEW COMPARISON:  May 09, 2021. FINDINGS: The heart size and mediastinal contours are within normal limits. No pneumothorax is noted. Minimal bibasilar subsegmental atelectasis is noted with small pleural effusions. The visualized skeletal structures are unremarkable. IMPRESSION: Minimal bibasilar subsegmental atelectasis with small pleural effusions. Electronically Signed   By: Marijo Conception M.D.   On: 05/14/2021 15:28   CT Angio Chest Pulmonary Embolism (PE) W or WO Contrast  Result Date: 05/15/2021 CLINICAL DATA:  Chest pain and shortness of breath. EXAM: CT ANGIOGRAPHY CHEST WITH CONTRAST TECHNIQUE: Multidetector CT imaging of the chest was performed using the standard protocol during bolus administration  of intravenous contrast. Multiplanar CT image reconstructions and MIPs were obtained to evaluate the vascular anatomy. CONTRAST:  64m OMNIPAQUE IOHEXOL 350 MG/ML SOLN COMPARISON:  CT scan 02/04/2021 FINDINGS: Cardiovascular: The heart is normal in size. No pericardial effusion. Stable tortuosity, ectasia and scattered calcification involving the thoracic aorta. The pulmonary arterial tree is well opacified. No filling defects to suggest pulmonary embolism. Mediastinum/Nodes: Stable scattered mediastinal and hilar lymph nodes  but no mass or overt adenopathy. The esophagus is grossly normal. Lungs/Pleura: Small bilateral pleural effusions are noted with overlying atelectasis. Patchy nodular ground-glass airspace opacities in both lower lobes could suggest early infiltrates or inflammation. No focal airspace consolidation. No worrisome pulmonary lesions. Upper Abdomen: No significant upper abdominal findings. Musculoskeletal: Marked cachexia. The bony thorax is intact. Review of the MIP images confirms the above findings. IMPRESSION: 1. No CT findings for pulmonary embolism. 2. Stable tortuosity, ectasia and scattered calcification of the thoracic aorta. 3. Small bilateral pleural effusions with overlying atelectasis. 4. Patchy nodular ground-glass airspace opacities in both lower lobes could suggest early infiltrates or inflammation. 5. Marked cachexia. 6. Aortic atherosclerosis. Aortic Atherosclerosis (ICD10-I70.0). Electronically Signed   By: Marijo Sanes M.D.   On: 05/15/2021 05:04   ECHOCARDIOGRAM COMPLETE  Result Date: 05/15/2021    ECHOCARDIOGRAM REPORT   Patient Name:   Mary Carson Date of Exam: 05/15/2021 Medical Rec #:  [id number]            Height:       65.0 in Accession #:    [id number]           Weight:       81.0 lb Date of Birth:  30-Apr-1946           BSA:          1.347 m Patient Age:    52 years             BP:           124/67 mmHg Patient Gender: F                    HR:           69  bpm. Exam Location:  Inpatient Procedure: 2D Echo, Cardiac Doppler and Color Doppler Indications:    Congestive Heart Failure I50.9  History:        Patient has no prior history of Echocardiogram examinations.                 CHF.  Sonographer:    Bernadene Person RDCS Referring Phys: [place]  1. Left ventricular ejection fraction, by estimation, is 60 to 65%. The left ventricle has normal function. The left ventricle has no regional wall motion abnormalities. Left ventricular diastolic parameters are consistent with Grade I diastolic dysfunction (impaired relaxation).  2. Right ventricular systolic function is normal. The right ventricular size is normal.  3. The mitral valve is normal in structure. Trivial mitral valve regurgitation. No evidence of mitral stenosis.  4. The aortic valve is tricuspid. There is mild calcification of the aortic valve. Aortic valve regurgitation is not visualized. No aortic stenosis is present. FINDINGS  Left Ventricle: Left ventricular ejection fraction, by estimation, is 60 to 65%. The left ventricle has normal function. The left ventricle has no regional wall motion abnormalities. The left ventricular internal cavity size was normal in size. There is  no left ventricular hypertrophy. Left ventricular diastolic parameters are consistent with Grade I diastolic dysfunction (impaired relaxation). Normal left ventricular filling pressure. Right Ventricle: The right ventricular size is normal. No increase in right ventricular wall thickness. Right ventricular systolic function is normal. Left Atrium: Left atrial size was normal in size. Right Atrium: Right atrial size was normal in size. Pericardium: There is no evidence of pericardial effusion. Mitral Valve: The mitral valve is normal in structure. Trivial mitral valve regurgitation. No evidence of mitral  valve stenosis. Tricuspid Valve: The tricuspid valve is normal in structure. Tricuspid valve regurgitation  is trivial. No evidence of tricuspid stenosis. Aortic Valve: The aortic valve is tricuspid. There is mild calcification of the aortic valve. Aortic valve regurgitation is not visualized. No aortic stenosis is present. Pulmonic Valve: The pulmonic valve was normal in structure. Pulmonic valve regurgitation is not visualized. No evidence of pulmonic stenosis. Aorta: The aortic root is normal in size and structure. Venous: The inferior vena cava was not well visualized. IAS/Shunts: No atrial level shunt detected by color flow Doppler.  LEFT VENTRICLE PLAX 2D LVIDd:         4.20 cm  Diastology LVIDs:         2.70 cm  LV e' medial:    6.60 cm/s LV PW:         0.80 cm  LV E/e' medial:  8.4 LV IVS:        0.90 cm  LV e' lateral:   7.81 cm/s LVOT diam:     2.00 cm  LV E/e' lateral: 7.1 LV SV:         39 LV SV Index:   29 LVOT Area:     3.14 cm  RIGHT VENTRICLE RV S prime:     13.40 cm/s TAPSE (M-mode): 1.9 cm LEFT ATRIUM           Index       RIGHT ATRIUM           Index LA diam:      2.90 cm 2.15 cm/m  RA Area:     10.70 cm LA Vol (A2C): 25.4 ml 18.85 ml/m RA Volume:   23.90 ml  17.74 ml/m LA Vol (A4C): 20.7 ml 15.37 ml/m  AORTIC VALVE LVOT Vmax:   66.10 cm/s LVOT Vmean:  42.500 cm/s LVOT VTI:    0.124 m  AORTA Ao Root diam: 3.10 cm Ao Asc diam:  3.00 cm MITRAL VALVE               TRICUSPID VALVE MV Area (PHT): 2.99 cm    TR Peak grad:   14.9 mmHg MV Decel Time: 254 msec    TR Vmax:        193.00 cm/s MV E velocity: 55.70 cm/s MV A velocity: 49.60 cm/s  SHUNTS MV E/A ratio:  1.12        Systemic VTI:  0.12 m                            Systemic Diam: 2.00 cm Skeet Latch MD Electronically signed by Skeet Latch MD Signature Date/Time: 05/15/2021/11:43:53 AM    Final

## 2021-05-15 NOTE — H&P (Addendum)
History and Physical    Mary Carson [id number] DOB: February 15, 1946 DOA: 05/14/2021  PCP: Georganna Skeans, PA-C  Patient coming from: Home.  Chief Complaint: Shortness of breath.  HPI: Mary Carson is a 75 y.o. female with history of hypertension was brought to the ER after patient was getting increasingly short of breath and was found to be hypoxic by EMS at arrival at the house.  As per the patient's son with whom I discussed patient was admitted last week for 4 days at [facility] for shortness of breath was found to be in A. fib which was new for the patient was placed on Eliquis at that time patient also was found to have UTI was placed on antibiotics Levaquin.  Patient has been since discharge and is about a week.  Today physical therapist was trying to ambulate patient when patient was found to be getting easily short of breath and hypoxic.  Denies any chest pain productive cough fever chills.  Over the last 1 year patient has lost 70 pounds.  Has gone to gastroenterologist Dr. Melina Copa at [place] and as per the patient's son had a EGD and endoscopy last year which were unremarkable.  ED Course: In the ER patient was tachycardic and chest x-ray was showing pleural effusion.  Labs show BNP of 212 potassium 3.4 WBC 10.8 macrocytic picture.  Patient was given Lasix 20 mg IV admitted for possible CHF.  COVID test was negative.  Review of Systems: As per HPI, rest all negative.   Past Medical History:  Diagnosis Date   Anxiety    CHF (congestive heart failure) (HCC)    Dysrhythmia    RMSF [facility] spotted fever)    Squamous cell carcinoma of scalp    Vitamin D deficiency    Weakness     Past Surgical History:  Procedure Laterality Date   ABDOMINAL HYSTERECTOMY       reports that she has never smoked. She has never used smokeless tobacco. She reports that she does not drink alcohol and does not use drugs.  Allergies  Allergen Reactions   Augmentin  [Amoxicillin-Pot Clavulanate]    Penicillins     Family History  Problem Relation Age of Onset   CAD Other     Prior to Admission medications   Medication Sig Start Date End Date Taking? Authorizing Provider  acetaminophen (TYLENOL) 325 MG tablet Take by mouth.    [provider]  aspirin 81 MG EC tablet Take by mouth.    [provider]  benzonatate (TESSALON) 100 MG capsule Take 1 capsule by mouth three times daily as needed for cough 12/11/20   Marge Duncans, PA-C  Cholecalciferol 25 MCG (1000 UT) tablet Take by mouth.    [provider]  citalopram (CELEXA) 20 MG tablet Take 1 tablet by mouth once daily 11/21/20   Rip Harbour, NP  lisinopril (ZESTRIL) 5 MG tablet Take 1 tablet (5 mg total) by mouth daily. 10/12/20   Marge Duncans, PA-C  LORazepam (ATIVAN) 1 MG tablet Take 1 tablet (1 mg total) by mouth at bedtime. 09/13/20   Marge Duncans, PA-C  megestrol (MEGACE) 20 MG tablet Take 1 tablet (20 mg total) by mouth daily. 01/04/21   Marge Duncans, PA-C  Omega-3 Fatty Acids (FISH OIL) 1000 MG CAPS Take by mouth.    [provider]  pantoprazole (PROTONIX) 40 MG tablet Take 40 mg by mouth daily. 06/15/20   [provider]  polyethylene glycol  powder (GLYCOLAX/MIRALAX) 17 GM/SCOOP powder SMARTSIG:1 Capful(s) By Mouth Twice Daily 03/16/20   [provider]    Physical Exam: Constitutional: Moderately built and nourished. Vitals:   05/14/21 2030 05/14/21 2045 05/14/21 2145 05/14/21 2215  BP: 108/72 111/73 109/70 106/65  Pulse: 98 (!) 102 (!) 102 (!) 115  Resp: 15 20 (!) 23 (!) 24  Temp:      TempSrc:      SpO2: 97% 99% 97% 99%   Eyes: Anicteric no pallor. ENMT: No discharge from the ears eyes nose and mouth. Neck: No JVD appreciated no mass felt. Respiratory: No rhonchi or crepitations. Cardiovascular: S1-S2 heard. Abdomen: Soft nontender bowel sound present. Musculoskeletal: No edema. Skin: No rash. Neurologic: Alert awake  oriented time place and person.  Moves all extremities. Psychiatric: Appears normal.  Normal affect.   Labs on Admission: I have personally reviewed following labs and imaging studies  CBC: Recent Labs  Lab 05/14/21 1324  WBC 10.8*  NEUTROABS 7.8*  HGB 15.1*  HCT 48.2*  MCV 101.7*  PLT [id number]   Basic Metabolic Panel: Recent Labs  Lab 05/14/21 1324  NA 133*  K 3.4*  CL 86*  CO2 36*  GLUCOSE 128*  BUN 10  CREATININE 0.41*  CALCIUM 9.1  MG 1.9   GFR: CrCl cannot be calculated (Unknown ideal weight.). Liver Function Tests: Recent Labs  Lab 05/14/21 1324  AST 19  ALT 18  ALKPHOS 28*  BILITOT 0.8  PROT 5.6*  ALBUMIN 3.4*   No results for input(s): LIPASE, AMYLASE in the last 168 hours. No results for input(s): AMMONIA in the last 168 hours. Coagulation Profile: No results for input(s): INR, PROTIME in the last 168 hours. Cardiac Enzymes: No results for input(s): CKTOTAL, CKMB, CKMBINDEX, TROPONINI in the last 168 hours. BNP (last 3 results) No results for input(s): PROBNP in the last 8760 hours. HbA1C: No results for input(s): HGBA1C in the last 72 hours. CBG: No results for input(s): GLUCAP in the last 168 hours. Lipid Profile: No results for input(s): CHOL, HDL, LDLCALC, TRIG, CHOLHDL, LDLDIRECT in the last 72 hours. Thyroid Function Tests: No results for input(s): TSH, T4TOTAL, FREET4, T3FREE, THYROIDAB in the last 72 hours. Anemia Panel: No results for input(s): VITAMINB12, FOLATE, FERRITIN, TIBC, IRON, RETICCTPCT in the last 72 hours. Urine analysis:    Component Value Date/Time   BILIRUBINUR negative 10/12/2020 1142   BILIRUBINUR neg 05/02/2020 1059   KETONESUR negative 10/12/2020 1142   PROTEINUR Negative 05/02/2020 1059   UROBILINOGEN 0.2 10/12/2020 1142   NITRITE Negative 10/12/2020 1142   NITRITE neg 05/02/2020 1059   LEUKOCYTESUR Negative 10/12/2020 1142   Sepsis Labs: '@LABRCNTIP'$ (procalcitonin:4,lacticidven:4) ) Recent Results (from the  past 240 hour(s))  Resp Panel by RT-PCR (Flu A&B, Covid) Nasopharyngeal Swab     Status: None   Collection Time: 05/14/21  6:24 PM   Specimen: Nasopharyngeal Swab; Nasopharyngeal(NP) swabs in vial transport medium  Result Value Ref Range Status   SARS Coronavirus 2 by RT PCR NEGATIVE NEGATIVE Final    Comment: (NOTE) SARS-CoV-2 target nucleic acids are NOT DETECTED.  The SARS-CoV-2 RNA is generally detectable in upper respiratory specimens during the acute phase of infection. The lowest concentration of SARS-CoV-2 viral copies this assay can detect is 138 copies/mL. A negative result does not preclude SARS-Cov-2 infection and should not be used as the sole basis for treatment or other patient management decisions. A negative result may occur with  improper specimen collection/handling, submission of specimen other than nasopharyngeal swab,  presence of viral mutation(s) within the areas targeted by this assay, and inadequate number of viral copies(<138 copies/mL). A negative result must be combined with clinical observations, patient history, and epidemiological information. The expected result is Negative.  Fact Sheet for Patients:  EntrepreneurPulse.com.au  Fact Sheet for Healthcare Providers:  IncredibleEmployment.be  This test is no t yet approved or cleared by the [place] FDA and  has been authorized for detection and/or diagnosis of SARS-CoV-2 by FDA under an Emergency Use Authorization (EUA). This EUA will remain  in effect (meaning this test can be used) for the duration of the COVID-19 declaration under Section 564(b)(1) of the Act, 21 U.S.C.section 360bbb-3(b)(1), unless the authorization is terminated  or revoked sooner.       Influenza A by PCR NEGATIVE NEGATIVE Final   Influenza B by PCR NEGATIVE NEGATIVE Final    Comment: (NOTE) The Xpert Xpress SARS-CoV-2/FLU/RSV plus assay is intended as an aid in the diagnosis of  influenza from Nasopharyngeal swab specimens and should not be used as a sole basis for treatment. Nasal washings and aspirates are unacceptable for Xpert Xpress SARS-CoV-2/FLU/RSV testing.  Fact Sheet for Patients: EntrepreneurPulse.com.au  Fact Sheet for Healthcare Providers: IncredibleEmployment.be  This test is not yet approved or cleared by the [place] FDA and has been authorized for detection and/or diagnosis of SARS-CoV-2 by FDA under an Emergency Use Authorization (EUA). This EUA will remain in effect (meaning this test can be used) for the duration of the COVID-19 declaration under Section 564(b)(1) of the Act, 21 U.S.C. section 360bbb-3(b)(1), unless the authorization is terminated or revoked.  Performed at [facility], [place] [address], [place], [place] [address]      Radiological Exams on Admission: DG Chest 2 View  Result Date: 05/14/2021 CLINICAL DATA:  Shortness of breath, hypoxia. EXAM: CHEST - 2 VIEW COMPARISON:  May 09, 2021. FINDINGS: The heart size and mediastinal contours are within normal limits. No pneumothorax is noted. Minimal bibasilar subsegmental atelectasis is noted with small pleural effusions. The visualized skeletal structures are unremarkable. IMPRESSION: Minimal bibasilar subsegmental atelectasis with small pleural effusions. Electronically Signed   By: Marijo Conception M.D.   On: 05/14/2021 15:28    EKG: Independently reviewed.  Tachycardia likely could be PAC.  Assessment/Plan Principal Problem:   Acute CHF (congestive heart failure) (HCC) Active Problems:   PAF (paroxysmal atrial fibrillation) ([place])    Possible new onset CHF for which 1 dose of Lasix was given 20 mg IV.  Will check 2D echo.  Closely follow intake output and metabolic panel.  Since patient appears tachypneic on my exam I have ordered a CT angiogram of the chest to rule out PE. Paroxysmal atrial fibrillation recently diagnosed at  [facility].  We will need to get records from [facility].  Patient is on apixaban and Eliquis.  We will check thyroid function test. Hypertension presently on metoprolol. History of depression and anxiety on Celexa and Ativan. Weight loss and failure to thrive has lost about 70 pounds.  As per the patient's son patient had followed up with Dr. Melina Copa gastroenterologist at [facility] and had EGD colonoscopy last year which was unremarkable.  We will follow CT angiogram of the chest. Macrocytosis -we will check B12 and folate levels with the next blood draw.  Some of the home medications needs to be verified.   DVT prophylaxis: Apixaban. Code Status: Full code.  Patient's son is going to call back again in the morning to  confirm this. Family Communication: Patient's son and grandson. Disposition Plan: Home when stable. Consults called: Cardiology.  Physical therapy. Admission status: Observation.   Rise Patience MD [facility] Pager [phone].  If 7PM-7AM, please contact night-coverage [email or web address] Password [facility]  05/15/2021, 12:05 AM

## 2021-05-15 NOTE — ED Notes (Addendum)
Family requesting medicine while pt is in the hospital. Prostat, senna stool softener and something for gas/bloating.

## 2021-05-16 ENCOUNTER — Inpatient Hospital Stay (HOSPITAL_COMMUNITY): Payer: Medicare HMO

## 2021-05-16 DIAGNOSIS — E43 Unspecified severe protein-calorie malnutrition: Secondary | ICD-10-CM | POA: Insufficient documentation

## 2021-05-16 DIAGNOSIS — I509 Heart failure, unspecified: Secondary | ICD-10-CM | POA: Diagnosis not present

## 2021-05-16 LAB — CBC
HCT: 44.7 % (ref 36.0–46.0)
Hemoglobin: 14.1 g/dL (ref 12.0–15.0)
MCH: 32.4 pg (ref 26.0–34.0)
MCHC: 31.5 g/dL (ref 30.0–36.0)
MCV: 102.8 fL — ABNORMAL HIGH (ref 80.0–100.0)
Platelets: 274 10*3/uL (ref 150–400)
RBC: 4.35 MIL/uL (ref 3.87–5.11)
RDW: 12.3 % (ref 11.5–15.5)
WBC: 9.3 10*3/uL (ref 4.0–10.5)
nRBC: 0 % (ref 0.0–0.2)

## 2021-05-16 LAB — BASIC METABOLIC PANEL
Anion gap: 8 (ref 5–15)
BUN: 12 mg/dL (ref 8–23)
CO2: 39 mmol/L — ABNORMAL HIGH (ref 22–32)
Calcium: 8.6 mg/dL — ABNORMAL LOW (ref 8.9–10.3)
Chloride: 88 mmol/L — ABNORMAL LOW (ref 98–111)
Creatinine, Ser: 0.48 mg/dL (ref 0.44–1.00)
GFR, Estimated: >60 mL/min (ref >60)
Glucose, Bld: 159 mg/dL — ABNORMAL HIGH (ref 70–99)
Potassium: 4.8 mmol/L (ref 3.5–5.1)
Sodium: 135 mmol/L (ref 135–145)

## 2021-05-16 MED ORDER — ADULT MULTIVITAMIN W/MINERALS CH
1.0000 | ORAL_TABLET | Freq: Every day | ORAL | Status: DC
  Administered 2021-05-16 – 2021-05-22 (×7): 1 via ORAL
  Filled 2021-05-16 (×7): qty 1

## 2021-05-16 NOTE — Progress Notes (Signed)
  Mobility Specialist Criteria Algorithm Info.  SATURATION QUALIFICATIONS: (This note is used to comply with regulatory documentation for home oxygen)  Patient Saturations on Room Air at Rest = n/a%  Patient Saturations on Room Air while Ambulating = n/a%  Patient Saturations on 2 Liters of oxygen while Ambulating = 93%  Please briefly explain why patient needs home oxygen:  Mobility Team: [facility] elevated:HOB 30 Activity: Ambulated in hall Range of motion: Active; All extremities Level of assistance: Contact guard assist, steadying assist (Min A supine/sit; Min A sit/stand; Min G ambulating) Assistive device: Front wheel walker Minutes sitting in chair:  Minutes stood: 5 minutes Minutes ambulated: 5 minutes Distance ambulated (ft): 100 ft Mobility response: Tolerated well Bed Position: Semi-fowlers  Patient received lying supine in bed, willing to participate in mobility despite not feeling well. Required minimal HHA from supine>sit>stand + cues for hand placement. Ambulated in hallway 100 feet at min guard with steady gait. Distance and speed limited by fatigue, constantly mentioning that she was tired and ready to sit down. Tolerated ambulation well without complaint or incident and was left lying in bed with all needs met.   05/16/2021 11:33 AM

## 2021-05-16 NOTE — Progress Notes (Signed)
Initial Nutrition Assessment  DOCUMENTATION CODES:   Underweight, Severe malnutrition in context of chronic illness  INTERVENTION:   -D/c Prosource Plus -Continue Ensure Enlive po BID, each supplement provides 350 kcal and 20 grams of protein  -Magic cup BID with meals, each supplement provides 290 kcal and 9 grams of protein  -MVI with minerals daily   NUTRITION DIAGNOSIS:   Severe Malnutrition related to chronic illness (CHF) as evidenced by severe fat depletion, severe muscle depletion, percent weight loss.  GOAL:   Patient will meet greater than or equal to 90% of their needs  MONITOR:   PO intake, Supplement acceptance, Labs, Weight trends, Skin, I & O's  REASON FOR ASSESSMENT:   Consult Assessment of nutrition requirement/status  ASSESSMENT:   [facility] is a 75 y.o. female with history of hypertension recently diagnosed atrial fibrillation and UTI at [facility] who was at home and being seen by home health, when home health staff came by yesterday they found her to be short of breath and hypoxic, she was brought to the ER where she was found to have acute on chronic diastolic CHF along with ongoing severe cachexia and unintentional weight loss.  Pt admitted with respiratory failure, CHF, and a-fib.   Reviewed I/O's: -1.1 L x 24 hours   UOP: 1.6 L x 24 hours  Spoke with pt at bedside, who reports a general decline in health over the past 6 months. She complains of shortness of breath, which has improved since admission. She shares that she has minimal appetite, however, this has improved since admission (pt consumed about 75% of breakfast, which is a large improvement for her). Pt reports she consumes 3 meals per day, which usually consist of a [employer] sausage biscuit or chicken and vegetables. Noted documented meal completions 10-80%.  Pt endorses wt loss, stating she has lost about 50 pounds over the past 6 months. Reviewed wt hx; pt has  experienced a 20.2% wt loss over the past 7 months, which is significant for time frame.   Discussed importance of good meal and supplement intake to promote healing. Pt amenable to Ensure supplements, as she consumes 1-2 per day PTA.   20.2% wt loss x 7 month  Labs reviewed.   NUTRITION - FOCUSED PHYSICAL EXAM:  Flowsheet Row Most Recent Value  Orbital Region Severe depletion  Upper Arm Region Severe depletion  Thoracic and Lumbar Region Severe depletion  Buccal Region Severe depletion  Temple Region Severe depletion  Clavicle Bone Region Severe depletion  Clavicle and Acromion Bone Region Severe depletion  Scapular Bone Region Severe depletion  Dorsal Hand Severe depletion  Patellar Region Severe depletion  Anterior Thigh Region Severe depletion  Posterior Calf Region Severe depletion  Edema (RD Assessment) None  Hair Reviewed  Eyes Reviewed  Mouth Reviewed  Skin Reviewed  Nails Reviewed       Diet Order:   Diet Order             Diet Heart Room service appropriate? Yes; Fluid consistency: Thin; Fluid restriction: 1200 mL Fluid  Diet effective now                   EDUCATION NEEDS:   Education needs have been addressed  Skin:  Skin Assessment: Reviewed RN Assessment  Last BM:  05/12/21  Height:   Ht Readings from Last 1 Encounters:  05/15/21 '5\' 5"'$  (1.651 m)    Weight:   Wt Readings from Last 1 Encounters:  05/16/21 37.7 kg    Ideal Body Weight:  56.8 kg  BMI:  Body mass index is 13.83 kg/m.  Estimated Nutritional Needs:   Kcal:  1500-1700  Protein:  75-90 grams  Fluid:  > 1.5 L    Loistine Chance, RD, LDN, [place] Registered Dietitian II Certified Diabetes Care and Education Specialist Please refer to [facility] for RD and/or RD on-call/weekend/after hours pager

## 2021-05-16 NOTE — Progress Notes (Signed)
Received call from pt's son with concerns that pt had "slurred speech" when son had visited between 6pm-7pm. Son asking if this nurse if pt could be evaluated for stroke since pt was having foot drop as well. Assured son that pt did not have slurred speech when this nurse did assessment with med pass. Full neuro checks completed at this time with no deficits noted other than right foot extension/flexion weak/absent. This nurse was notified of this in report and that MD was notified of this today as well. Will continue to monitor pt and notified charge nurse as well.

## 2021-05-16 NOTE — Progress Notes (Signed)
Physical Therapy Evaluation Patient Details Name: [facility] MRN: [id number] DOB: 19-Feb-1946 Today's Date: 05/16/2021   History of Present Illness  Pt is a 75yo female presenting to [facility] ED on 7/25 with chief complaint of hypoxia and weakness. Thought to be secondary to acute CHF. Xray revealed pleural effusion. CT angio negative for PE. PMH: HTN, recent diagnosis of AFIB,  anxiety, rocky [place] spotted fever.  Clinical Impression  Pt presents with the impairments listed above and problems below. Pt required min assist for bed mobility; min guard for transfers and ambulation in the hallway with RW. Of note, during ambulation pt exhibited right foot drop; during assessment pt exhibited decreased right ankle dorsiflexion range of motion compared to left ankle. Pt reported this is new since arriving at [facility] and RN was notified. Pt was on 2L of O2 with 98% saturation at end of session. Pt has family help at home 24/7 and recommend continuing HHPT at d/c. Pt will benefit from acute skilled therapy to promote independence with functional mobility.    Follow Up Recommendations [facility] PT;Supervision for mobility/OOB    Equipment Recommendations  None recommended by PT    Recommendations for Other Services       Precautions / Restrictions Precautions Precautions: Fall Restrictions Weight Bearing Restrictions: No      Mobility  Bed Mobility Overal bed mobility: Needs Assistance Bed Mobility: Supine to Sit;Sit to Supine     Supine to sit: Min assist Sit to supine: Min assist   General bed mobility comments: Min A for trunk mobility to sit and LE assist for return to supine.    Transfers Overall transfer level: Needs assistance Equipment used: Rolling walker (2 wheeled) Transfers: Sit to/from Stand Sit to Stand: Min guard         General transfer comment: Min guard for safety  Ambulation/Gait Ambulation/Gait assistance: Min guard Gait Distance (Feet): 150  Feet Assistive device: Rolling walker (2 wheeled) Gait Pattern/deviations: Decreased dorsiflexion - right;Step-through pattern Gait velocity: decreased   General Gait Details: Right foot drop during swing phase of gait with noted increase in hip flexion to compensate. Min guard for safety. Required safety cues during gait.  Stairs            Wheelchair Mobility    Modified Rankin (Stroke Patients Only)       Balance Overall balance assessment: Mild deficits observed, not formally tested                                           Pertinent Vitals/Pain Pain Assessment: No/denies pain    Home Living Family/patient expects to be discharged to:: Private residence Living Arrangements: Alone Available Help at Discharge: Family;Available 24 hours/day (also has home [facility] services) Type of Home: House Home Access: Stairs to enter Entrance Stairs-Rails: None Entrance Stairs-Number of Steps: 3 Home Layout: One level Home Equipment: Walker - 2 wheels;Other (comment) (lift chair)      Prior Function Level of Independence: Independent with assistive device(s)         Comments: Pt utilizes RW. Reports independence with ADLs     Hand Dominance        Extremity/Trunk Assessment   Upper Extremity Assessment Upper Extremity Assessment: Generalized weakness    Lower Extremity Assessment Lower Extremity Assessment: Generalized weakness;RLE deficits/detail RLE Deficits / Details: During gait, noted foot drop during swing phase. Per pt  report, has started since admission at [facility]. Communicated to RN. during supine assessment, pt has decreased R ankle dorsiflexion compared to L.    Cervical / Trunk Assessment Cervical / Trunk Assessment: Normal  Communication   Communication: HOH  Cognition Arousal/Alertness: Awake/alert Behavior During Therapy: WFL for tasks assessed/performed Overall Cognitive Status: No family/caregiver present to determine baseline  cognitive functioning                                 General Comments: Pt had recent hospital stay [facility]) and seems to be unclear as to pertinent history events at that site vs. [facility].      General Comments General comments (skin integrity, edema, etc.): [name]; sacral pad intact    Exercises     Assessment/Plan    PT Assessment Patient needs continued PT services  PT Problem List Decreased strength;Decreased range of motion;Decreased activity tolerance;Decreased balance;Decreased mobility;Cardiopulmonary status limiting activity;Decreased coordination;Decreased safety awareness       PT Treatment Interventions Functional mobility training;Therapeutic activities;Therapeutic exercise;Balance training;Patient/family education;Stair training;Gait training    PT Goals (Current goals can be found in the Care Plan section)  Acute Rehab PT Goals Patient Stated Goal: to go home PT Goal Formulation: With patient Time For Goal Achievement: 05/30/21 Potential to Achieve Goals: Good    Frequency Min 3X/week   Barriers to discharge        Co-evaluation               AM-PAC PT "6 Clicks" Mobility  Outcome Measure Help needed turning from your back to your side while in a flat bed without using bedrails?: None Help needed moving from lying on your back to sitting on the side of a flat bed without using bedrails?: A Little Help needed moving to and from a bed to a chair (including a wheelchair)?: A Little Help needed standing up from a chair using your arms (e.g., wheelchair or bedside chair)?: A Little Help needed to walk in hospital room?: A Little Help needed climbing 3-5 steps with a railing? : A Lot 6 Click Score: 18    End of Session Equipment Utilized During Treatment: Gait belt;Oxygen (2L of O2; 98% at end of session) Activity Tolerance: Patient tolerated treatment well;Patient limited by fatigue Patient left: in bed;with call  bell/phone within reach;with bed alarm set Nurse Communication: Mobility status (Also communicated R foot drop visualized during gait) PT Visit Diagnosis: Muscle weakness (generalized) (M62.81);Unsteadiness on feet (R26.81);Other abnormalities of gait and mobility (R26.89)    Time: [id number] PT Time Calculation (min) (ACUTE ONLY): 30 min   Charges:   PT Evaluation $PT Eval Low Complexity: 1 Low PT Treatments $Gait Training: 8-22 mins        Dawayne Cirri, SPT  Dawayne Cirri 05/16/2021, 4:30 PM

## 2021-05-16 NOTE — Progress Notes (Addendum)
PROGRESS NOTE    Mary Carson  [id number] DOB: [birth year] DOA: 05/14/2021 PCP: Mary Carson   Brief Narrative: 75 year old with past medical history significant for hypertension, recently diagnosed with A. fib and UTI at [facility] who was at home and being seen by home health, when home health staff came by to her house they found her to be short of breath and hypoxic.  Patient was brought to the ER was found to have acute on chronic diastolic heart failure exacerbation with ongoing severe cachexia and unintentional weight loss.    Assessment & Plan:   Principal Problem:   Acute CHF (congestive heart failure) (HCC) Active Problems:   PAF (paroxysmal atrial fibrillation) (HCC)  1-Acute Hypoxic Respiratory Failure due to acute on chronic diastolic heart failure, new onset A. fib: -Patient received IV Lasix yesterday. -Plan to follow-up chest x-ray. -Blood pressure was soft last night, and her CO2 is elevated at 39 and her chloride is low.  Plan to hold Lasix today. -Urine output yesterday 1.6 l    2-new diagnosis of paroxysmal A. fib: Continue with Coreg and Eliquis Echo noted with preserved ejection fraction.  TSH stable  4-severe cachexia with 70 pound unintentional weight loss and moderate protein caloric malnutrition: Started on Megace and protein supplements Outpatient weight loss work-up directed by PCP Recent cortisol level was normal.   5-anxiety, depression: Continue with Celexa, ativan.   6-Hypokalemia: Replace     Estimated body mass index is 13.83 kg/m as calculated from the following:   Height as of this encounter: '5\' 5"'$  (1.651 m).   Weight as of this encounter: 37.7 kg.   DVT prophylaxis: Eliquis Code Status: DNR Family Communication: Son updated.  Disposition Plan:  Status is: Inpatient  Remains inpatient appropriate because:IV treatments appropriate due to intensity of illness or inability to take PO  Dispo: The patient is  from: Home              Anticipated d/c is to: Home              Patient currently is not medically stable to d/c.   Difficult to place patient No        Consultants:  None  Procedures:  Echo; preserved ejection fraction  Antimicrobials:    Subjective: She is feeling weak and tired, she denies worsening shortness of breath.  Objective: Vitals:   05/16/21 0037 05/16/21 0430 05/16/21 0743 05/16/21 1131  BP: 104/70 123/74 116/81 115/67  Pulse: 88 91 (!) 102 (!) 54  Resp: '17 17 16 16  '$ Temp: 97.7 F (36.5 C) 98.1 F (36.7 C) 97.6 F (36.4 C) (!) 97.5 F (36.4 C)  TempSrc: Oral Oral Oral Oral  SpO2: 100% 100% 98% 93%  Weight:  37.7 kg    Height:        Intake/Output Summary (Last 24 hours) at 05/16/2021 1350 Last data filed at 05/16/2021 1100 Gross per 24 hour  Intake 835 ml  Output 1600 ml  Net -765 ml   Filed Weights   05/15/21 0000 05/15/21 1333 05/16/21 0430  Weight: 36.7 kg 40.2 kg 37.7 kg    Examination:  General exam: Appears calm and comfortable  Respiratory system: crackles bases Cardiovascular system: S1 & S2 heard, RRR. Gastrointestinal system: Abdomen is nondistended, soft and nontender. No organomegaly or masses felt. Normal bowel sounds heard. Central nervous system: Alert and oriented.  Extremities: Symmetric 5 x 5 power.    Data Reviewed: I have personally reviewed following  labs and imaging studies  CBC: Recent Labs  Lab 05/14/21 1324 05/15/21 0307 05/16/21 0818  WBC 10.8* 6.7 9.3  NEUTROABS 7.8*  --   --   HGB 15.1* 11.1* 14.1  HCT 48.2* 35.4* 44.7  MCV 101.7* 104.4* 102.8*  PLT 307 219 [id number]   Basic Metabolic Panel: Recent Labs  Lab 05/14/21 1324 05/15/21 0307 05/16/21 0818  NA 133* 135 135  K 3.4* 3.1* 4.8  CL 86* 96* 88*  CO2 36* 35* 39*  GLUCOSE 128* 92 159*  BUN '10 9 12  '$ CREATININE 0.41* 0.33* 0.48  CALCIUM 9.1 6.6* 8.6*  MG 1.9  --   --    GFR: Estimated Creatinine Clearance: 36.2 mL/min (by C-G formula based  on SCr of 0.48 mg/dL). Liver Function Tests: Recent Labs  Lab 05/14/21 1324  AST 19  ALT 18  ALKPHOS 28*  BILITOT 0.8  PROT 5.6*  ALBUMIN 3.4*   No results for input(s): LIPASE, AMYLASE in the last 168 hours. No results for input(s): AMMONIA in the last 168 hours. Coagulation Profile: No results for input(s): INR, PROTIME in the last 168 hours. Cardiac Enzymes: No results for input(s): CKTOTAL, CKMB, CKMBINDEX, TROPONINI in the last 168 hours. BNP (last 3 results) No results for input(s): PROBNP in the last 8760 hours. HbA1C: No results for input(s): HGBA1C in the last 72 hours. CBG: No results for input(s): GLUCAP in the last 168 hours. Lipid Profile: No results for input(s): CHOL, HDL, LDLCALC, TRIG, CHOLHDL, LDLDIRECT in the last 72 hours. Thyroid Function Tests: Recent Labs    05/15/21 0307  TSH 2.092   Anemia Panel: Recent Labs    05/15/21 0307  VITAMINB12 400  FOLATE 11.0   Sepsis Labs: No results for input(s): PROCALCITON, LATICACIDVEN in the last 168 hours.  Recent Results (from the past 240 hour(s))  Resp Panel by RT-PCR (Flu A&B, Covid) Nasopharyngeal Swab     Status: None   Collection Time: 05/14/21  6:24 PM   Specimen: Nasopharyngeal Swab; Nasopharyngeal(NP) swabs in vial transport medium  Result Value Ref Range Status   SARS Coronavirus 2 by RT PCR NEGATIVE NEGATIVE Final    Comment: (NOTE) SARS-CoV-2 target nucleic acids are NOT DETECTED.  The SARS-CoV-2 RNA is generally detectable in upper respiratory specimens during the acute phase of infection. The lowest concentration of SARS-CoV-2 viral copies this assay can detect is 138 copies/mL. A negative result does not preclude SARS-Cov-2 infection and should not be used as the sole basis for treatment or other patient management decisions. A negative result may occur with  improper specimen collection/handling, submission of specimen other than nasopharyngeal swab, presence of viral mutation(s)  within the areas targeted by this assay, and inadequate number of viral copies(<138 copies/mL). A negative result must be combined with clinical observations, patient history, and epidemiological information. The expected result is Negative.  Fact Sheet for Patients:  EntrepreneurPulse.com.au  Fact Sheet for Healthcare Providers:  IncredibleEmployment.be  This test is no t yet approved or cleared by the [place] FDA and  has been authorized for detection and/or diagnosis of SARS-CoV-2 by FDA under an Emergency Use Authorization (EUA). This EUA will remain  in effect (meaning this test can be used) for the duration of the COVID-19 declaration under Section 564(b)(1) of the Act, 21 U.S.C.section 360bbb-3(b)(1), unless the authorization is terminated  or revoked sooner.       Influenza A by PCR NEGATIVE NEGATIVE Final   Influenza B by PCR NEGATIVE NEGATIVE Final  Comment: (NOTE) The Xpert Xpress SARS-CoV-2/FLU/RSV plus assay is intended as an aid in the diagnosis of influenza from Nasopharyngeal swab specimens and should not be used as a sole basis for treatment. Nasal washings and aspirates are unacceptable for Xpert Xpress SARS-CoV-2/FLU/RSV testing.  Fact Sheet for Patients: EntrepreneurPulse.com.au  Fact Sheet for Healthcare Providers: IncredibleEmployment.be  This test is not yet approved or cleared by the [place] FDA and has been authorized for detection and/or diagnosis of SARS-CoV-2 by FDA under an Emergency Use Authorization (EUA). This EUA will remain in effect (meaning this test can be used) for the duration of the COVID-19 declaration under Section 564(b)(1) of the Act, 21 U.S.C. section 360bbb-3(b)(1), unless the authorization is terminated or revoked.  Performed at [facility], [place] [address], [place], [place] [address]          Radiology Studies: DG Chest 2  View  Result Date: 05/14/2021 CLINICAL DATA:  Shortness of breath, hypoxia. EXAM: CHEST - 2 VIEW COMPARISON:  May 09, 2021. FINDINGS: The heart size and mediastinal contours are within normal limits. No pneumothorax is noted. Minimal bibasilar subsegmental atelectasis is noted with small pleural effusions. The visualized skeletal structures are unremarkable. IMPRESSION: Minimal bibasilar subsegmental atelectasis with small pleural effusions. Electronically Signed   By: Marijo Conception M.D.   On: 05/14/2021 15:28   CT Angio Chest Pulmonary Embolism (PE) W or WO Contrast  Result Date: 05/15/2021 CLINICAL DATA:  Chest pain and shortness of breath. EXAM: CT ANGIOGRAPHY CHEST WITH CONTRAST TECHNIQUE: Multidetector CT imaging of the chest was performed using the standard protocol during bolus administration of intravenous contrast. Multiplanar CT image reconstructions and MIPs were obtained to evaluate the vascular anatomy. CONTRAST:  3m OMNIPAQUE IOHEXOL 350 MG/ML SOLN COMPARISON:  CT scan 02/04/2021 FINDINGS: Cardiovascular: The heart is normal in size. No pericardial effusion. Stable tortuosity, ectasia and scattered calcification involving the thoracic aorta. The pulmonary arterial tree is well opacified. No filling defects to suggest pulmonary embolism. Mediastinum/Nodes: Stable scattered mediastinal and hilar lymph nodes but no mass or overt adenopathy. The esophagus is grossly normal. Lungs/Pleura: Small bilateral pleural effusions are noted with overlying atelectasis. Patchy nodular ground-glass airspace opacities in both lower lobes could suggest early infiltrates or inflammation. No focal airspace consolidation. No worrisome pulmonary lesions. Upper Abdomen: No significant upper abdominal findings. Musculoskeletal: Marked cachexia. The bony thorax is intact. Review of the MIP images confirms the above findings. IMPRESSION: 1. No CT findings for pulmonary embolism. 2. Stable tortuosity, ectasia and  scattered calcification of the thoracic aorta. 3. Small bilateral pleural effusions with overlying atelectasis. 4. Patchy nodular ground-glass airspace opacities in both lower lobes could suggest early infiltrates or inflammation. 5. Marked cachexia. 6. Aortic atherosclerosis. Aortic Atherosclerosis (ICD10-I70.0). Electronically Signed   By: PMarijo SanesM.D.   On: 05/15/2021 05:04   ECHOCARDIOGRAM COMPLETE  Result Date: 05/15/2021    ECHOCARDIOGRAM REPORT   Patient Name:   Mary CITODate of Exam: 05/15/2021 Medical Rec #:  [id number]           Height:       65.0 in Accession #:    [id number]          Weight:       81.0 lb Date of Birth:  103-23-1947          BSA:          1.347 m Patient Age:    89years  BP:           124/67 mmHg Patient Gender: F                    HR:           69 bpm. Exam Location:  Inpatient Procedure: 2D Echo, Cardiac Doppler and Color Doppler Indications:    Congestive Heart Failure I50.9  History:        Patient has no prior history of Echocardiogram examinations.                 CHF.  Sonographer:    Bernadene Person RDCS Referring Phys: [place]  1. Left ventricular ejection fraction, by estimation, is 60 to 65%. The left ventricle has normal function. The left ventricle has no regional wall motion abnormalities. Left ventricular diastolic parameters are consistent with Grade I diastolic dysfunction (impaired relaxation).  2. Right ventricular systolic function is normal. The right ventricular size is normal.  3. The mitral valve is normal in structure. Trivial mitral valve regurgitation. No evidence of mitral stenosis.  4. The aortic valve is tricuspid. There is mild calcification of the aortic valve. Aortic valve regurgitation is not visualized. No aortic stenosis is present. FINDINGS  Left Ventricle: Left ventricular ejection fraction, by estimation, is 60 to 65%. The left ventricle has normal function. The left ventricle has no  regional wall motion abnormalities. The left ventricular internal cavity size was normal in size. There is  no left ventricular hypertrophy. Left ventricular diastolic parameters are consistent with Grade I diastolic dysfunction (impaired relaxation). Normal left ventricular filling pressure. Right Ventricle: The right ventricular size is normal. No increase in right ventricular wall thickness. Right ventricular systolic function is normal. Left Atrium: Left atrial size was normal in size. Right Atrium: Right atrial size was normal in size. Pericardium: There is no evidence of pericardial effusion. Mitral Valve: The mitral valve is normal in structure. Trivial mitral valve regurgitation. No evidence of mitral valve stenosis. Tricuspid Valve: The tricuspid valve is normal in structure. Tricuspid valve regurgitation is trivial. No evidence of tricuspid stenosis. Aortic Valve: The aortic valve is tricuspid. There is mild calcification of the aortic valve. Aortic valve regurgitation is not visualized. No aortic stenosis is present. Pulmonic Valve: The pulmonic valve was normal in structure. Pulmonic valve regurgitation is not visualized. No evidence of pulmonic stenosis. Aorta: The aortic root is normal in size and structure. Venous: The inferior vena cava was not well visualized. IAS/Shunts: No atrial level shunt detected by color flow Doppler.  LEFT VENTRICLE PLAX 2D LVIDd:         4.20 cm  Diastology LVIDs:         2.70 cm  LV e' medial:    6.60 cm/s LV PW:         0.80 cm  LV E/e' medial:  8.4 LV IVS:        0.90 cm  LV e' lateral:   7.81 cm/s LVOT diam:     2.00 cm  LV E/e' lateral: 7.1 LV SV:         39 LV SV Index:   29 LVOT Area:     3.14 cm  RIGHT VENTRICLE RV S prime:     13.40 cm/s TAPSE (M-mode): 1.9 cm LEFT ATRIUM           Index       RIGHT ATRIUM  Index LA diam:      2.90 cm 2.15 cm/m  RA Area:     10.70 cm LA Vol (A2C): 25.4 ml 18.85 ml/m RA Volume:   23.90 ml  17.74 ml/m LA Vol (A4C): 20.7  ml 15.37 ml/m  AORTIC VALVE LVOT Vmax:   66.10 cm/s LVOT Vmean:  42.500 cm/s LVOT VTI:    0.124 m  AORTA Ao Root diam: 3.10 cm Ao Asc diam:  3.00 cm MITRAL VALVE               TRICUSPID VALVE MV Area (PHT): 2.99 cm    TR Peak grad:   14.9 mmHg MV Decel Time: 254 msec    TR Vmax:        193.00 cm/s MV E velocity: 55.70 cm/s MV A velocity: 49.60 cm/s  SHUNTS MV E/A ratio:  1.12        Systemic VTI:  0.12 m                            Systemic Diam: 2.00 cm Skeet Latch MD Electronically signed by Skeet Latch MD Signature Date/Time: 05/15/2021/11:43:53 AM    Final         Scheduled Meds:  (feeding supplement) PROSource Plus  30 mL Oral BID BM   apixaban  5 mg Oral BID   citalopram  20 mg Oral Daily   feeding supplement  237 mL Oral BID BM   LORazepam  0.5 mg Oral QHS   megestrol  200 mg Oral BID   metoprolol tartrate  25 mg Oral BID   pantoprazole  40 mg Oral Daily   Continuous Infusions:   LOS: 1 day    Time spent: 35 minutes    Jyquan Kenley A Manpreet Kemmer, MD [facility]   If 7PM-7AM, please contact night-coverage [email or web address]  05/16/2021, 1:50 PM

## 2021-05-16 NOTE — Plan of Care (Signed)

## 2021-05-17 ENCOUNTER — Inpatient Hospital Stay (HOSPITAL_COMMUNITY): Payer: Medicare HMO

## 2021-05-17 DIAGNOSIS — I509 Heart failure, unspecified: Secondary | ICD-10-CM | POA: Diagnosis not present

## 2021-05-17 LAB — CBC
HCT: 40.3 % (ref 36.0–46.0)
Hemoglobin: 12.9 g/dL (ref 12.0–15.0)
MCH: 32.7 pg (ref 26.0–34.0)
MCHC: 32 g/dL (ref 30.0–36.0)
MCV: 102.3 fL — ABNORMAL HIGH (ref 80.0–100.0)
Platelets: 252 10*3/uL (ref 150–400)
RBC: 3.94 MIL/uL (ref 3.87–5.11)
RDW: 12.3 % (ref 11.5–15.5)
WBC: 11.3 10*3/uL — ABNORMAL HIGH (ref 4.0–10.5)
nRBC: 0 % (ref 0.0–0.2)

## 2021-05-17 LAB — BASIC METABOLIC PANEL
Anion gap: 6 (ref 5–15)
BUN: 17 mg/dL (ref 8–23)
CO2: 39 mmol/L — ABNORMAL HIGH (ref 22–32)
Calcium: 8.3 mg/dL — ABNORMAL LOW (ref 8.9–10.3)
Chloride: 88 mmol/L — ABNORMAL LOW (ref 98–111)
Creatinine, Ser: <0.3 mg/dL — ABNORMAL LOW (ref 0.44–1.00)
Glucose, Bld: 103 mg/dL — ABNORMAL HIGH (ref 70–99)
Potassium: 4.5 mmol/L (ref 3.5–5.1)
Sodium: 133 mmol/L — ABNORMAL LOW (ref 135–145)

## 2021-05-17 MED ORDER — FUROSEMIDE 20 MG PO TABS
20.0000 mg | ORAL_TABLET | Freq: Every day | ORAL | Status: DC
  Administered 2021-05-17: 20 mg via ORAL
  Filled 2021-05-17: qty 1

## 2021-05-17 NOTE — Progress Notes (Signed)
PROGRESS NOTE    Mary Carson  [id number] DOB: [birth year] DOA: 05/14/2021 PCP: [name]   Brief Narrative: 75 year old with past medical history significant for hypertension, recently diagnosed with A. fib and UTI at [facility] who was at home and being seen by home health, when home health staff came by to her house they found her to be short of breath and hypoxic.  Patient was brought to the ER was found to have acute on chronic diastolic heart failure exacerbation with ongoing severe cachexia and unintentional weight loss.    Assessment & Plan:   Principal Problem:   Acute CHF (congestive heart failure) (HCC) Active Problems:   PAF (paroxysmal atrial fibrillation) (HCC)   Protein-calorie malnutrition, severe  1-Acute Hypoxic Respiratory Failure due to acute on chronic diastolic heart failure, new onset A. fib: -Patient received IV Lasix 7/26 -Plan to follow-up chest x-ray. -Blood pressure was soft last night, and her CO2 is elevated at 39 and her chloride is low.  Plan to hold Lasix today. -Weight: 83---87 Plan to start low dose lasix. Repeat labs tomorrow.  Evaluate for home oxygen needs.   2-new diagnosis of paroxysmal A. fib: Continue with Coreg and Eliquis Echo noted with preserved ejection fraction.  TSH stable  4-severe cachexia with 70 pound unintentional weight loss and moderate protein caloric malnutrition: Started on Megace and protein supplements Outpatient weight loss work-up directed by PCP Recent cortisol level was normal.   5-Anxiety, depression: Continue with Celexa, ativan.   6-Hypokalemia: Replace 7-Right foot drop;  No other focal deficit, but will proceed with MRI brain.      Estimated body mass index is 14.49 kg/m as calculated from the following:   Height as of this encounter: '5\' 5"'$  (1.651 m).   Weight as of this encounter: 39.5 kg.   DVT prophylaxis: Eliquis Code Status: DNR Family Communication: Son updated  7/28 Disposition Plan:  Status is: Inpatient  Remains inpatient appropriate because:IV treatments appropriate due to intensity of illness or inability to take PO  Dispo: The patient is from: Home              Anticipated d/c is to: Home              Patient currently is not medically stable to d/c.   Difficult to place patient No        Consultants:  None  Procedures:  Echo; preserved ejection fraction  Antimicrobials:    Subjective: She is weak, she report eating more. Denies worsening Dyspnea.  She had BM yesterday,   Objective: Vitals:   05/16/21 1941 05/17/21 0033 05/17/21 0448 05/17/21 1141  BP: 131/86  114/73 (!) 97/58  Pulse: 89  89 66  Resp: '19  18 16  '$ Temp: 98.3 F (36.8 C)  98.4 F (36.9 C) 97.6 F (36.4 C)  TempSrc: Oral  Oral Oral  SpO2: 95%  99% 100%  Weight:  39.5 kg    Height:        Intake/Output Summary (Last 24 hours) at 05/17/2021 1246 Last data filed at 05/17/2021 1245 Gross per 24 hour  Intake 717 ml  Output 800 ml  Net -83 ml    Filed Weights   05/15/21 1333 05/16/21 0430 05/17/21 0033  Weight: 40.2 kg 37.7 kg 39.5 kg    Examination:  General exam: NAD Respiratory system: Crackles bases Cardiovascular system: S 1, S 2 RRR Gastrointestinal system: BS present, soft, nt Central nervous system: Alert Extremities: Symmetric power.  Data Reviewed: I have personally reviewed following labs and imaging studies  CBC: Recent Labs  Lab 05/14/21 1324 05/15/21 0307 05/16/21 0818 05/17/21 0339  WBC 10.8* 6.7 9.3 11.3*  NEUTROABS 7.8*  --   --   --   HGB 15.1* 11.1* 14.1 12.9  HCT 48.2* 35.4* 44.7 40.3  MCV 101.7* 104.4* 102.8* 102.3*  PLT 307 219 274 [id number]    Basic Metabolic Panel: Recent Labs  Lab 05/14/21 1324 05/15/21 0307 05/16/21 0818 05/17/21 0339  NA 133* 135 135 133*  K 3.4* 3.1* 4.8 4.5  CL 86* 96* 88* 88*  CO2 36* 35* 39* 39*  GLUCOSE 128* 92 159* 103*  BUN '10 9 12 17  '$ CREATININE 0.41* 0.33* 0.48  <0.30*  CALCIUM 9.1 6.6* 8.6* 8.3*  MG 1.9  --   --   --     GFR: CrCl cannot be calculated (This lab value cannot be used to calculate CrCl because it is not a number: <0.30). Liver Function Tests: Recent Labs  Lab 05/14/21 1324  AST 19  ALT 18  ALKPHOS 28*  BILITOT 0.8  PROT 5.6*  ALBUMIN 3.4*    No results for input(s): LIPASE, AMYLASE in the last 168 hours. No results for input(s): AMMONIA in the last 168 hours. Coagulation Profile: No results for input(s): INR, PROTIME in the last 168 hours. Cardiac Enzymes: No results for input(s): CKTOTAL, CKMB, CKMBINDEX, TROPONINI in the last 168 hours. BNP (last 3 results) No results for input(s): PROBNP in the last 8760 hours. HbA1C: No results for input(s): HGBA1C in the last 72 hours. CBG: No results for input(s): GLUCAP in the last 168 hours. Lipid Profile: No results for input(s): CHOL, HDL, LDLCALC, TRIG, CHOLHDL, LDLDIRECT in the last 72 hours. Thyroid Function Tests: Recent Labs    05/15/21 0307  TSH 2.092    Anemia Panel: Recent Labs    05/15/21 0307  VITAMINB12 400  FOLATE 11.0    Sepsis Labs: No results for input(s): PROCALCITON, LATICACIDVEN in the last 168 hours.  Recent Results (from the past 240 hour(s))  Resp Panel by RT-PCR (Flu A&B, Covid) Nasopharyngeal Swab     Status: None   Collection Time: 05/14/21  6:24 PM   Specimen: Nasopharyngeal Swab; Nasopharyngeal(NP) swabs in vial transport medium  Result Value Ref Range Status   SARS Coronavirus 2 by RT PCR NEGATIVE NEGATIVE Final    Comment: (NOTE) SARS-CoV-2 target nucleic acids are NOT DETECTED.  The SARS-CoV-2 RNA is generally detectable in upper respiratory specimens during the acute phase of infection. The lowest concentration of SARS-CoV-2 viral copies this assay can detect is 138 copies/mL. A negative result does not preclude SARS-Cov-2 infection and should not be used as the sole basis for treatment or other patient management  decisions. A negative result may occur with  improper specimen collection/handling, submission of specimen other than nasopharyngeal swab, presence of viral mutation(s) within the areas targeted by this assay, and inadequate number of viral copies(<138 copies/mL). A negative result must be combined with clinical observations, patient history, and epidemiological information. The expected result is Negative.  Fact Sheet for Patients:  EntrepreneurPulse.com.au  Fact Sheet for Healthcare Providers:  IncredibleEmployment.be  This test is no t yet approved or cleared by the [place] FDA and  has been authorized for detection and/or diagnosis of SARS-CoV-2 by FDA under an Emergency Use Authorization (EUA). This EUA will remain  in effect (meaning this test can be used) for the duration of the COVID-19 declaration  under Section 564(b)(1) of the Act, 21 U.S.C.section 360bbb-3(b)(1), unless the authorization is terminated  or revoked sooner.       Influenza A by PCR NEGATIVE NEGATIVE Final   Influenza B by PCR NEGATIVE NEGATIVE Final    Comment: (NOTE) The Xpert Xpress SARS-CoV-2/FLU/RSV plus assay is intended as an aid in the diagnosis of influenza from Nasopharyngeal swab specimens and should not be used as a sole basis for treatment. Nasal washings and aspirates are unacceptable for Xpert Xpress SARS-CoV-2/FLU/RSV testing.  Fact Sheet for Patients: EntrepreneurPulse.com.au  Fact Sheet for Healthcare Providers: IncredibleEmployment.be  This test is not yet approved or cleared by the [place] FDA and has been authorized for detection and/or diagnosis of SARS-CoV-2 by FDA under an Emergency Use Authorization (EUA). This EUA will remain in effect (meaning this test can be used) for the duration of the COVID-19 declaration under Section 564(b)(1) of the Act, 21 U.S.C. section 360bbb-3(b)(1), unless the  authorization is terminated or revoked.  Performed at [facility], [place] [address], [place], [place] [address]           Radiology Studies: DG CHEST PORT 1 VIEW  Result Date: 05/16/2021 CLINICAL DATA:  Shortness of breath.  CHF EXAM: PORTABLE CHEST 1 VIEW COMPARISON:  Radiograph 05/14/2021, chest CT 05/15/2021 FINDINGS: Unchanged cardiomediastinal silhouette. There is no new focal airspace disease. There is no large pleural effusion or visible pneumothorax. There is no acute osseous abnormality. IMPRESSION: No focal airspace disease. Electronically Signed   By: Maurine Simmering   On: 05/16/2021 14:35        Scheduled Meds:  apixaban  5 mg Oral BID   citalopram  20 mg Oral Daily   feeding supplement  237 mL Oral BID BM   furosemide  20 mg Oral Daily   LORazepam  0.5 mg Oral QHS   megestrol  200 mg Oral BID   metoprolol tartrate  25 mg Oral BID   multivitamin with minerals  1 tablet Oral Daily   pantoprazole  40 mg Oral Daily   Continuous Infusions:   LOS: 2 days    Time spent: 35 minutes    Amarri Michaelson A Clifton Safley, MD [facility]   If 7PM-7AM, please contact night-coverage [email or web address]  05/17/2021, 12:46 PM

## 2021-05-17 NOTE — Progress Notes (Signed)
   05/17/21 1400  Mobility  Activity Refused mobility (Declined for unspecified reasons)

## 2021-05-17 NOTE — Plan of Care (Signed)

## 2021-05-17 NOTE — Plan of Care (Signed)

## 2021-05-17 NOTE — TOC Progression Note (Addendum)
Transition of Care [facility]) - Progression Note    Patient Details  Name: Mary Carson MRN: [id number] Date of Birth: 25-Jun-1946  Transition of Care [facility]) CM/SW Contact  Mary Mayo, RN Phone Number: 05/17/2021, 3:54 PM  Clinical Narrative:    NCM spoke with patient at bedside, she states to call her son Mary Carson, [place] offered choice, he states she is with [facility] for [place], and they would like to continue with them.  NCM confirmed this with Dorian Pod at [facility].  NCM also asked son , Mary Carson if he wants to use [employer] for home oxygen if she needs it , he was asking about cost, NCM will have [employer] rep to call him.   7/29- Patient will need home oxygen, referral given to [facility] with [employer], this will be brought to patient's room prior to dc.   Expected Discharge Plan: [place] Barriers to Discharge: Continued Medical Work up  Expected Discharge Plan and Services Expected Discharge Plan: [place]   Discharge Planning Services: CM Consult Post Acute Care Choice: [place] arrangements for the past 2 months: Single Family Home                   DME Agency: NA       HH Arranged: PT, RN [place] Agency: [place] Date [place]: 05/17/21 Time [place]: [place] Representative spoke with at [place]: [place] ([place]) Interventions    Readmission Risk Interventions No flowsheet data found.

## 2021-05-17 NOTE — Evaluation (Signed)
Occupational Therapy Evaluation Patient Details Name: Mary Carson MRN: [id number] DOB: 03/01/1946 Today's Date: 05/17/2021    History of Present Illness Pt is a 75yo female presenting to [facility] ED on 7/25 with chief complaint of hypoxia and weakness. Thought to be secondary to acute CHF. Xray revealed pleural effusion. CT angio negative for PE. PMH: HTN, recent diagnosis of AFIB,  anxiety, rocky mountain spotted fever.   Clinical Impression   PTA patient reports independent with Adls and mobility using RW, assist from sister/grandson for IADLs.  Admitted for above and presenting with problem list below, including impaired balance, decreased activity tolerance, generalized weakness and decreased problem solving.  Pt on 2L during session with VSS, fatigues easily and requires increased rest breaks.  She requires min assist for transfers and in room mobility using RW, up to min assist for ADLs. Believe she will benefit from further OT services while admitted and after dc at [facility] level to optimize independence and safety.     Follow Up Recommendations  Home health OT;Supervision/Assistance - 24 hour    Equipment Recommendations  None recommended by OT    Recommendations for Other Services       Precautions / Restrictions Precautions Precautions: Fall Restrictions Weight Bearing Restrictions: No      Mobility Bed Mobility Overal bed mobility: Needs Assistance Bed Mobility: Supine to Sit;Sit to Supine     Supine to sit: Min assist Sit to supine: Min guard   General bed mobility comments: for trunk support to ascend to EOB, min guard to return to supine    Transfers Overall transfer level: Needs assistance Equipment used: Rolling walker (2 wheeled) Transfers: Sit to/from Stand Sit to Stand: Min assist;Min guard         General transfer comment: min assist from EOB and min guard from recliner, cueing for hand placement and safety    Balance Overall balance  assessment: Needs assistance Sitting-balance support: No upper extremity supported;Feet supported Sitting balance-Leahy Scale: Fair     Standing balance support: Bilateral upper extremity supported;During functional activity Standing balance-Leahy Scale: Poor Standing balance comment: relies on RW in standing                           ADL either performed or assessed with clinical judgement   ADL Overall ADL's : Needs assistance/impaired     Grooming: Set up;Sitting           Upper Body Dressing : Set up;Sitting   Lower Body Dressing: Minimal assistance;Sit to/from stand   Toilet Transfer: Minimal assistance;Stand-pivot;BSC;RW   Toileting- Clothing Manipulation and Hygiene: Minimal assistance;Sit to/from stand;Sitting/lateral lean       Functional mobility during ADLs: Minimal assistance;Rolling walker;Cueing for safety General ADL Comments: pt limited by weakness, balance, and activity tolerance     Vision Baseline Vision/History: Wears glasses Wears Glasses: Reading only Patient Visual Report: No change from baseline Vision Assessment?: No apparent visual deficits     Perception     Praxis      Pertinent Vitals/Pain Pain Assessment: No/denies pain     Hand Dominance Right   Extremity/Trunk Assessment Upper Extremity Assessment Upper Extremity Assessment: Generalized weakness (mild R sided weakness > compared to L side)   Lower Extremity Assessment Lower Extremity Assessment: Defer to PT evaluation   Cervical / Trunk Assessment Cervical / Trunk Assessment: Normal   Communication Communication Communication: HOH   Cognition Arousal/Alertness: Awake/alert Behavior During Therapy: WFL for tasks assessed/performed Overall  Cognitive Status: No family/caregiver present to determine baseline cognitive functioning Area of Impairment: Problem solving                             Problem Solving: Slow processing;Requires verbal  cues General Comments: pt with decreased problem sovling, and requires increased time for processing [facility] as well)   General Comments  assisted pt up to recliner, unable to remain sitting due to "uncomfortable on her bottom"    Exercises     Shoulder Instructions      Home Living Family/patient expects to be discharged to:: Private residence Living Arrangements: Alone Available Help at Discharge: Family;Available PRN/intermittently Type of Home: House Home Access: Stairs to enter [employer] of Steps: 3 Entrance Stairs-Rails: None Home Layout: One level     Bathroom Shower/Tub: Occupational psychologist: Handicapped height     Home Equipment: Environmental consultant - 2 wheels;Other (comment);Shower seat;Bedside commode (lift chair)   Additional Comments: `      Prior Functioning/Environment Level of Independence: Independent with assistive device(s)        Comments: uses RW for ADLs and mobility, assist with IADLs from family (grandson assists with meds)        OT Problem List: Decreased strength;Decreased activity tolerance;Impaired balance (sitting and/or standing);Decreased safety awareness;Decreased knowledge of use of DME or AE;Decreased knowledge of precautions;Cardiopulmonary status limiting activity;Decreased cognition      OT Treatment/Interventions: Self-care/ADL training;Therapeutic exercise;DME and/or AE instruction;Therapeutic activities;Patient/family education;Balance training;Energy conservation    OT Goals(Current goals can be found in the care plan section) Acute Rehab OT Goals Patient Stated Goal: to go home OT Goal Formulation: With patient Time For Goal Achievement: 05/31/21 Potential to Achieve Goals: Good  OT Frequency: Min 2X/week   Barriers to D/C:            Co-evaluation              AM-PAC OT "6 Clicks" Daily Activity     Outcome Measure Help from another person eating meals?: A Little Help from another person taking  care of personal grooming?: A Little Help from another person toileting, which includes using toliet, bedpan, or urinal?: A Little Help from another person bathing (including washing, rinsing, drying)?: A Little Help from another person to put on and taking off regular upper body clothing?: A Little Help from another person to put on and taking off regular lower body clothing?: A Little 6 Click Score: 18   End of Session Equipment Utilized During Treatment: Rolling walker;Oxygen (2L) Nurse Communication: Mobility status;Other (comment) (request for gas meds)  Activity Tolerance: Patient tolerated treatment well Patient left: in bed;with call bell/phone within reach;with bed alarm set  OT Visit Diagnosis: Other abnormalities of gait and mobility (R26.89);Muscle weakness (generalized) (M62.81)                Time: [id number] OT Time Calculation (min): 37 min Charges:  OT General Charges $OT Visit: 1 Visit OT Evaluation $OT Eval Moderate Complexity: 1 Mod OT Treatments $Self Care/Home Management : 8-22 mins  Jolaine Artist, OT Acute Rehabilitation Services Pager [phone] Office [phone]   Delight Stare 05/17/2021, 10:34 AM

## 2021-05-18 ENCOUNTER — Inpatient Hospital Stay (HOSPITAL_COMMUNITY): Payer: Medicare HMO

## 2021-05-18 DIAGNOSIS — I509 Heart failure, unspecified: Secondary | ICD-10-CM | POA: Diagnosis not present

## 2021-05-18 DIAGNOSIS — J90 Pleural effusion, not elsewhere classified: Secondary | ICD-10-CM

## 2021-05-18 DIAGNOSIS — E43 Unspecified severe protein-calorie malnutrition: Secondary | ICD-10-CM

## 2021-05-18 LAB — BASIC METABOLIC PANEL
Anion gap: 4 — ABNORMAL LOW (ref 5–15)
BUN: 20 mg/dL (ref 8–23)
CO2: 40 mmol/L — ABNORMAL HIGH (ref 22–32)
Calcium: 8.6 mg/dL — ABNORMAL LOW (ref 8.9–10.3)
Chloride: 90 mmol/L — ABNORMAL LOW (ref 98–111)
Creatinine, Ser: 0.42 mg/dL — ABNORMAL LOW (ref 0.44–1.00)
GFR, Estimated: >60 mL/min (ref >60)
Glucose, Bld: 96 mg/dL (ref 70–99)
Potassium: 5.2 mmol/L — ABNORMAL HIGH (ref 3.5–5.1)
Sodium: 134 mmol/L — ABNORMAL LOW (ref 135–145)

## 2021-05-18 MED ORDER — SODIUM POLYSTYRENE SULFONATE 15 GM/60ML PO SUSP
15.0000 g | Freq: Once | ORAL | Status: AC
  Administered 2021-05-18: 15 g via ORAL
  Filled 2021-05-18: qty 60

## 2021-05-18 NOTE — Progress Notes (Signed)
PROGRESS NOTE    Mary Carson  [id number] DOB: May 09, 1946 DOA: 05/14/2021 PCP: [name]   Brief Narrative: 75 year old with past medical history significant for hypertension, recently diagnosed with A. fib and UTI at [facility] who was at home and being seen by home health, when home health staff came by to her house they found her to be short of breath and hypoxic.  Patient was brought to the ER was found to have acute on chronic diastolic heart failure exacerbation with ongoing severe cachexia and unintentional weight loss.    Assessment & Plan:   Principal Problem:   Acute CHF (congestive heart failure) (HCC) Active Problems:   PAF (paroxysmal atrial fibrillation) (HCC)   Protein-calorie malnutrition, severe  1-Acute Hypoxic Respiratory Failure due to possible acute on chronic diastolic heart failure, new onset A. fib: -Patient received IV Lasix 7/26, and oral lasix 7/28. -Weight: 83---87--84 Evaluate for home oxygen needs.  She is still on 3-4 L oxygen, CO2 elevated,  chloride 90. Weight down. I Discussed case with cardiology , ECHO results are not impressive, at this time we cant explain her hypoxemia to be related to HF.  Pulmonologist consulted.  Hold lasix.   2-new diagnosis of paroxysmal A. fib: Continue with Coreg and Eliquis Echo noted with preserved ejection fraction.  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   5-Anxiety, depression: Continue with Celexa, ativan.   6-Hypokalemia: Replace.Hyperkalemia: will give Kayexalate.   7-Right foot drop;  MRI negative for Stroke.  Discussed with neurology, will proceed with MRI lumbar spine, if negative for nerve compression patient will need to follow up with neurologist out patient for nerve conduction  test,.     Estimated body mass index is 14.06 kg/m as calculated from the following:   Height as of this encounter: '5\' 5"'$  (1.651 m).   Weight as of this encounter: 38.3 kg.   DVT prophylaxis: Eliquis Code Status: DNR Family Communication: Son updated 7/29 Disposition Plan:  Status is: Inpatient  Remains inpatient appropriate because:IV treatments appropriate due to intensity of illness or inability to take PO  Dispo: The patient is from: Home              Anticipated d/c is to: Home              Patient currently is not medically stable to d/c. Plan to discharge tomorrow if electrolytes stable, and Resp status stable and MRI lumbar spine negative.    Difficult to place patient No        Consultants:  None  Procedures:  Echo; preserved ejection fraction  Antimicrobials:    Subjective: She is eating more. Denies diarrhea, had BM.  She denies worsening dyspnea.   Objective: Vitals:   05/18/21 0010 05/18/21 0448 05/18/21 1008 05/18/21 1106  BP:  121/71 114/82 116/76  Pulse:  73  88  Resp:  17  16  Temp:  98.1 F (36.7 C)  97.7 F (36.5 C)  TempSrc:  Oral  Oral  SpO2:  100% 98% 100%  Weight: 38.3 kg     Height:        Intake/Output Summary (Last 24 hours) at 05/18/2021 1143 Last data filed at 05/18/2021 0813 Gross per 24 hour  Intake 837 ml  Output 545 ml  Net 292 ml    Danley Danker  Weights   05/16/21 0430 05/17/21 0033 05/18/21 0010  Weight: 37.7 kg 39.5 kg 38.3 kg    Examination:  General exam: NAD Respiratory system: CTA Cardiovascular system: S 1, S 2 RRR Gastrointestinal system: BS present, soft, nt Central nervous system: alert, follows command. No focal defficti other than right foot drop.  Extremities: no edema    Data Reviewed: I have personally reviewed following labs and imaging studies  CBC: Recent Labs  Lab 05/14/21 1324 05/15/21 0307 05/16/21 0818 05/17/21 0339  WBC 10.8* 6.7 9.3 11.3*  NEUTROABS 7.8*  --   --   --   HGB  15.1* 11.1* 14.1 12.9  HCT 48.2* 35.4* 44.7 40.3  MCV 101.7* 104.4* 102.8* 102.3*  PLT 307 219 274 [id number]    Basic Metabolic Panel: Recent Labs  Lab 05/14/21 1324 05/15/21 0307 05/16/21 0818 05/17/21 0339 05/18/21 0244  NA 133* 135 135 133* 134*  K 3.4* 3.1* 4.8 4.5 5.2*  CL 86* 96* 88* 88* 90*  CO2 36* 35* 39* 39* 40*  GLUCOSE 128* 92 159* 103* 96  BUN '10 9 12 17 20  '$ CREATININE 0.41* 0.33* 0.48 <0.30* 0.42*  CALCIUM 9.1 6.6* 8.6* 8.3* 8.6*  MG 1.9  --   --   --   --     GFR: Estimated Creatinine Clearance: 36.7 mL/min (A) (by C-G formula based on SCr of 0.42 mg/dL (L)). Liver Function Tests: Recent Labs  Lab 05/14/21 1324  AST 19  ALT 18  ALKPHOS 28*  BILITOT 0.8  PROT 5.6*  ALBUMIN 3.4*    No results for input(s): LIPASE, AMYLASE in the last 168 hours. No results for input(s): AMMONIA in the last 168 hours. Coagulation Profile: No results for input(s): INR, PROTIME in the last 168 hours. Cardiac Enzymes: No results for input(s): CKTOTAL, CKMB, CKMBINDEX, TROPONINI in the last 168 hours. BNP (last 3 results) No results for input(s): PROBNP in the last 8760 hours. HbA1C: No results for input(s): HGBA1C in the last 72 hours. CBG: No results for input(s): GLUCAP in the last 168 hours. Lipid Profile: No results for input(s): CHOL, HDL, LDLCALC, TRIG, CHOLHDL, LDLDIRECT in the last 72 hours. Thyroid Function Tests: No results for input(s): TSH, T4TOTAL, FREET4, T3FREE, THYROIDAB in the last 72 hours.  Anemia Panel: No results for input(s): VITAMINB12, FOLATE, FERRITIN, TIBC, IRON, RETICCTPCT in the last 72 hours.  Sepsis Labs: No results for input(s): PROCALCITON, LATICACIDVEN in the last 168 hours.  Recent Results (from the past 240 hour(s))  Resp Panel by RT-PCR (Flu A&B, Covid) Nasopharyngeal Swab     Status: None   Collection Time: 05/14/21  6:24 PM   Specimen: Nasopharyngeal Swab; Nasopharyngeal(NP) swabs in vial transport medium  Result Value Ref Range  Status   SARS Coronavirus 2 by RT PCR NEGATIVE NEGATIVE Final    Comment: (NOTE) SARS-CoV-2 target nucleic acids are NOT DETECTED.  The SARS-CoV-2 RNA is generally detectable in upper respiratory specimens during the acute phase of infection. The lowest concentration of SARS-CoV-2 viral copies this assay can detect is 138 copies/mL. A negative result does not preclude SARS-Cov-2 infection and should not be used as the sole basis for treatment or other patient management decisions. A negative result may occur with  improper specimen collection/handling, submission of specimen other than nasopharyngeal swab, presence of viral mutation(s) within the areas targeted by this assay, and inadequate number of viral copies(<138 copies/mL). A negative result must be combined with clinical observations, patient history, and epidemiological information. The  expected result is Negative.  Fact Sheet for Patients:  EntrepreneurPulse.com.au  Fact Sheet for Healthcare Providers:  IncredibleEmployment.be  This test is no t yet approved or cleared by the [place] FDA and  has been authorized for detection and/or diagnosis of SARS-CoV-2 by FDA under an Emergency Use Authorization (EUA). This EUA will remain  in effect (meaning this test can be used) for the duration of the COVID-19 declaration under Section 564(b)(1) of the Act, 21 U.S.C.section 360bbb-3(b)(1), unless the authorization is terminated  or revoked sooner.       Influenza A by PCR NEGATIVE NEGATIVE Final   Influenza B by PCR NEGATIVE NEGATIVE Final    Comment: (NOTE) The Xpert Xpress SARS-CoV-2/FLU/RSV plus assay is intended as an aid in the diagnosis of influenza from Nasopharyngeal swab specimens and should not be used as a sole basis for treatment. Nasal washings and aspirates are unacceptable for Xpert Xpress SARS-CoV-2/FLU/RSV testing.  Fact Sheet for  Patients: EntrepreneurPulse.com.au  Fact Sheet for Healthcare Providers: IncredibleEmployment.be  This test is not yet approved or cleared by the [place] FDA and has been authorized for detection and/or diagnosis of SARS-CoV-2 by FDA under an Emergency Use Authorization (EUA). This EUA will remain in effect (meaning this test can be used) for the duration of the COVID-19 declaration under Section 564(b)(1) of the Act, 21 U.S.C. section 360bbb-3(b)(1), unless the authorization is terminated or revoked.  Performed at [facility], [place] [address], [place], [place] [address]           Radiology Studies: MR BRAIN WO CONTRAST  Result Date: 05/17/2021 CLINICAL DATA:  Neuro deficit, acute, stroke suspected. EXAM: MRI HEAD WITHOUT CONTRAST TECHNIQUE: Multiplanar, multiecho pulse sequences of the brain and surrounding structures were obtained without intravenous contrast. COMPARISON:  Prior head CT examinations 05/16/2020 and earlier. FINDINGS: Brain: Mild generalized cerebral and cerebellar atrophy. Mild-to-moderate multifocal T2/FLAIR hyperintensity within the cerebral white matter, nonspecific but compatible with chronic small vessel ischemic disease. Mild chronic small vessel ischemic changes are also present within the pons. There is no acute infarct. No evidence of an intracranial mass. No chronic intracranial blood products. No extra-axial fluid collection. No midline shift. Vascular: Expected proximal arterial flow voids. Skull and upper cervical spine: No focal suspicious marrow lesion. Sinuses/Orbits: Visualized orbits show no acute finding. Minimal bilateral ethmoid and maxillary sinus mucosal thickening. Other: Trace fluid within the bilateral mastoid air cells. IMPRESSION: No evidence of acute intracranial abnormality. Chronic small-vessel ischemic changes which are mild-to-moderate in the cerebral white matter, and mild in the pons. Mild  generalized parenchymal atrophy. Minimal paranasal sinus disease at the imaged levels, as described. Trace fluid within the bilateral mastoid air cells. Electronically Signed   By: Kellie Simmering DO   On: 05/17/2021 14:07   DG CHEST PORT 1 VIEW  Result Date: 05/16/2021 CLINICAL DATA:  Shortness of breath.  CHF EXAM: PORTABLE CHEST 1 VIEW COMPARISON:  Radiograph 05/14/2021, chest CT 05/15/2021 FINDINGS: Unchanged cardiomediastinal silhouette. There is no new focal airspace disease. There is no large pleural effusion or visible pneumothorax. There is no acute osseous abnormality. IMPRESSION: No focal airspace disease. Electronically Signed   By: Maurine Simmering   On: 05/16/2021 14:35        Scheduled Meds:  apixaban  5 mg Oral BID   citalopram  20 mg Oral Daily   feeding supplement  237 mL Oral BID BM   LORazepam  0.5 mg Oral QHS   megestrol  200 mg Oral  BID   metoprolol tartrate  25 mg Oral BID   multivitamin with minerals  1 tablet Oral Daily   pantoprazole  40 mg Oral Daily   Continuous Infusions:   LOS: 3 days    Time spent: 35 minutes    Valori Hollenkamp A Emoni Yang, MD [facility]   If 7PM-7AM, please contact night-coverage [email or web address]  05/18/2021, 11:43 AM

## 2021-05-18 NOTE — Progress Notes (Signed)
SATURATION QUALIFICATIONS: (This note is used to comply with regulatory documentation for home oxygen)  Patient Saturations on Room Air at Rest = 94%  Patient Saturations on Room Air while Ambulating = 87%  Patient Saturations on 3 Liters of oxygen while Ambulating = 96%  Please briefly explain why patient needs home oxygen:

## 2021-05-18 NOTE — Plan of Care (Signed)

## 2021-05-18 NOTE — Progress Notes (Signed)
Physical Therapy Treatment Patient Details Name: Mary Carson MRN: [id number] DOB: [birth year] Today's Date: 05/18/2021    History of Present Illness Pt is a 75yo female presenting to [facility] ED on 7/25 with chief complaint of hypoxia and weakness. Thought to be secondary to acute CHF. Xray revealed pleural effusion. CT angio negative for PE. PMH: HTN, recent diagnosis of AFIB,  anxiety, rocky mountain spotted fever.    PT Comments    Pt remains limited by fatigue. Pt demonstrates mild instability initially when ambulating, with balance improving with continued ambulation. Pt will benefit from continued acute PT services to improve activity tolerance and provide education on energy conservation. PT recommends assistance for all out of bed from family initially upon return home. Pt is agreeable to this. Pt will also benefit from HHPT at the time of discharge.   Follow Up Recommendations  Home health PT;Supervision for mobility/OOB     Equipment Recommendations  None recommended by PT    Recommendations for Other Services       Precautions / Restrictions Precautions Precautions: Fall Restrictions Weight Bearing Restrictions: No    Mobility  Bed Mobility Overal bed mobility: Needs Assistance Bed Mobility: Rolling;Sidelying to Sit Rolling: Modified independent (Device/Increase time) (use of railing) Sidelying to sit: Supervision;HOB elevated Supine to sit: Mod assist     General bed mobility comments: pt elevating head of bed to ~45 degrees to aide in bed mobility despite PT encouragement to attempt with HOB flat    Transfers Overall transfer level: Needs assistance Equipment used: Rolling walker (2 wheeled) Transfers: Sit to/from Stand Sit to Stand: Min guard Stand pivot transfers: Min assist          Ambulation/Gait Ambulation/Gait assistance: Min guard;Min assist Gait Distance (Feet): 150 Feet Assistive device: Rolling walker (2 wheeled) Gait  Pattern/deviations: Step-through pattern Gait velocity: decreased Gait velocity interpretation: <1.8 ft/sec, indicate of risk for recurrent falls General Gait Details: pt with slowed step-through gait, one leftward loss of balance during initial 10' of gait requiring minA to correct, otherwise no LOB noted   Stairs             Wheelchair Mobility    Modified Rankin (Stroke Patients Only)       Balance Overall balance assessment: Needs assistance Sitting-balance support: No upper extremity supported;Feet supported Sitting balance-Leahy Scale: Good     Standing balance support: Single extremity supported;Bilateral upper extremity supported Standing balance-Leahy Scale: Poor Standing balance comment: relies on RW in standing                            Cognition Arousal/Alertness: Awake/alert Behavior During Therapy: WFL for tasks assessed/performed Overall Cognitive Status: No family/caregiver present to determine baseline cognitive functioning Area of Impairment: Problem solving                             Problem Solving: Slow processing        Exercises      General Comments General comments (skin integrity, edema, etc.): pt on RA upon arrival with sats in mid 90s. With ambulation sats drop to 87% with pt reports of SOB. PT places pt on 2L [place] with no noted increase in sats. PT then increases to 3L [place] with improvement in sats to 95%. PT weans pt to 2L [place] with sats ranging from 92-94% for last ~50' of ambulation. Pt recovers and is left on RA  at rest.      Pertinent Vitals/Pain Pain Assessment: No/denies pain    Home Living                      Prior Function            PT Goals (current goals can now be found in the care plan section) Acute Rehab PT Goals Patient Stated Goal: to go home Progress towards PT goals: Progressing toward goals    Frequency    Min 3X/week      PT Plan Current plan remains appropriate     Co-evaluation              AM-PAC PT "6 Clicks" Mobility   Outcome Measure  Help needed turning from your back to your side while in a flat bed without using bedrails?: None Help needed moving from lying on your back to sitting on the side of a flat bed without using bedrails?: A Little Help needed moving to and from a bed to a chair (including a wheelchair)?: A Little Help needed standing up from a chair using your arms (e.g., wheelchair or bedside chair)?: A Little Help needed to walk in hospital room?: A Little Help needed climbing 3-5 steps with a railing? : A Lot 6 Click Score: 18    End of Session Equipment Utilized During Treatment: Oxygen Activity Tolerance: Patient limited by fatigue Patient left: in bed;with call bell/phone within reach;with bed alarm set Nurse Communication: Mobility status PT Visit Diagnosis: Muscle weakness (generalized) (M62.81);Unsteadiness on feet (R26.81);Other abnormalities of gait and mobility (R26.89)     Time: [id number] PT Time Calculation (min) (ACUTE ONLY): 18 min  Charges:  $Gait Training: 8-22 mins                     Zenaida Niece, PT, DPT Acute Rehabilitation Pager: [phone]    Zenaida Niece 05/18/2021, 2:30 PM

## 2021-05-18 NOTE — Progress Notes (Addendum)
NAME:  Mary Carson, MRN:  [id number], DOB:  [birth year], LOS: 3 ADMISSION DATE:  05/14/2021, CONSULTATION DATE:  05/18/2021 REFERRING MD:  Dr. Tyrell Antonio, CHIEF COMPLAINT:  Hypoxia   History of Present Illness:  Mary Carson is a 75 y.o. female with PMH significant for diastolic congestive heart failure, RMSF, squamous celll carcinoma of scalp, vitamin D deficiency, and cachexia with profound malnutrition who presented to the ED for dyspnea with associated hypoxia. Patient was admitted per [facility] for volume overload likely secondary to CHF exacerbation.   On hospital day 3 PCCM was consulted for assistance in managing hypoxia in the setting of bilateral small pleural effusion and ground glass opacities.   Pertinent  Medical History  Diastolic congestive heart failure RMSF Squamous celll carcinoma of scalp Vitamin D deficiency Cachexia with profound malnutrition  Significant Hospital Events:  7/25  admitted for SOB and weakness   Interim History / Subjective:  Seen sitting up in bed in NAD, she continues to complain of significant weakness   Objective   Blood pressure 116/76, pulse 88, temperature 97.7 F (36.5 C), temperature source Oral, resp. rate 16, height '5\' 5"'$  (1.651 m), weight 38.3 kg, SpO2 100 %.        Intake/Output Summary (Last 24 hours) at 05/18/2021 1233 Last data filed at 05/18/2021 0813 Gross per 24 hour  Intake 837 ml  Output 545 ml  Net 292 ml   Filed Weights   05/16/21 0430 05/17/21 0033 05/18/21 0010  Weight: 37.7 kg 39.5 kg 38.3 kg    Examination: General: Acute on chronically ill appearing cachetic elderly female lying in bed, in NAD HEENT: [place]/AT, MM pink/moist, PERRL,  Neuro: Alert and oriented x3, non-focal  CV: s1s2 regular rate and rhythm, no murmur, rubs, or gallops,  PULM:  Clear to ascultation, no added breath sounds, no increased work of breathing, oxygen weaned to RA while assessing patient   GI: soft, bowel sounds active in all 4  quadrants, non-tender, non-distended Extremities: warm/dry, no edema  Skin: no rashes or lesions   Resolved Hospital Problem list     Assessment & Plan:  Dsypnea -Likely secondary to profound weakness in the setting of severe cachexia.  -On replacement of SPO2 monitor patient was seen with oxygne saturations of 94-96% on RA P: Oxygen saturation goal greater than 90 Encourage frequent use of IS and flutter valve  Mobilize as able  Aggressive PT/OT efforts  Head of bed elevated 30 degrees.   PCCM will sign off. Thank you for the opportunity to participate in this patient's care. Please contact if we can be of further assistance.   Best Practice    Diet/type: Regular consistency (see orders) DVT prophylaxis: Eliquis GI prophylaxis: PPI Lines: N/A Foley:  N/A Code Status:  DNR Last date of multidisciplinary goals of care discussion: Per Primary   Labs   CBC: Recent Labs  Lab 05/14/21 1324 05/15/21 0307 05/16/21 0818 05/17/21 0339  WBC 10.8* 6.7 9.3 11.3*  NEUTROABS 7.8*  --   --   --   HGB 15.1* 11.1* 14.1 12.9  HCT 48.2* 35.4* 44.7 40.3  MCV 101.7* 104.4* 102.8* 102.3*  PLT 307 219 274 [id number]    Basic Metabolic Panel: Recent Labs  Lab 05/14/21 1324 05/15/21 0307 05/16/21 0818 05/17/21 0339 05/18/21 0244  NA 133* 135 135 133* 134*  K 3.4* 3.1* 4.8 4.5 5.2*  CL 86* 96* 88* 88* 90*  CO2 36* 35* 39* 39* 40*  GLUCOSE 128* 92 159* 103*  96  BUN '10 9 12 17 20  '$ CREATININE 0.41* 0.33* 0.48 <0.30* 0.42*  CALCIUM 9.1 6.6* 8.6* 8.3* 8.6*  MG 1.9  --   --   --   --    GFR: Estimated Creatinine Clearance: 36.7 mL/min (A) (by C-G formula based on SCr of 0.42 mg/dL (L)). Recent Labs  Lab 05/14/21 1324 05/15/21 0307 05/16/21 0818 05/17/21 0339  WBC 10.8* 6.7 9.3 11.3*    Liver Function Tests: Recent Labs  Lab 05/14/21 1324  AST 19  ALT 18  ALKPHOS 28*  BILITOT 0.8  PROT 5.6*  ALBUMIN 3.4*   No results for input(s): LIPASE, AMYLASE in the last 168  hours. No results for input(s): AMMONIA in the last 168 hours.  ABG No results found for: PHART, PCO2ART, PO2ART, HCO3, TCO2, ACIDBASEDEF, O2SAT   Coagulation Profile: No results for input(s): INR, PROTIME in the last 168 hours.  Cardiac Enzymes: No results for input(s): CKTOTAL, CKMB, CKMBINDEX, TROPONINI in the last 168 hours.  HbA1C: No results found for: HGBA1C  CBG: No results for input(s): GLUCAP in the last 168 hours.  Review of Systems:   Please see the history of present illness. All other systems reviewed and are negative   Past Medical History:  She,  has a past medical history of Anxiety, CHF (congestive heart failure) ([place]), Dysrhythmia, RMSF [facility] spotted fever), Squamous cell carcinoma of scalp, Vitamin D deficiency, and Weakness.   Surgical History:   Past Surgical History:  Procedure Laterality Date   ABDOMINAL HYSTERECTOMY       Social History:   reports that she has never smoked. She has never used smokeless tobacco. She reports that she does not drink alcohol and does not use drugs.   Family History:  Her family history includes CAD in an other family member.   Allergies Allergies  Allergen Reactions   Augmentin [Amoxicillin-Pot Clavulanate]    Penicillins      Home Medications  Prior to Admission medications   Medication Sig Start Date End Date Taking? Authorizing Provider  acetaminophen (TYLENOL) 325 MG tablet Take by mouth.   Yes [provider]  alendronate (FOSAMAX) 70 MG tablet Take 70 mg by mouth once a week. 04/22/21  Yes [provider]  aspirin 81 MG EC tablet Take by mouth.   Yes [provider]  calcium carbonate (TUMS - DOSED IN MG ELEMENTAL CALCIUM) 500 MG chewable tablet Chew 1 tablet by mouth daily.   Yes [provider]  Cholecalciferol 25 MCG (1000 UT) tablet Take 1,000 Units by mouth daily.   Yes [provider]  citalopram (CELEXA) 20 MG tablet Take 1 tablet by mouth once  daily Patient taking differently: Take 40 mg by mouth at bedtime. 11/21/20  Yes Rip Harbour, NP  dicyclomine (BENTYL) 10 MG capsule Take 10 mg by mouth 3 times/day as needed-between meals & bedtime for spasms.   Yes [provider]  levofloxacin (LEVAQUIN) 500 MG tablet Take 500 mg by mouth daily.   Yes [provider]  LORazepam (ATIVAN) 1 MG tablet Take 1 tablet (1 mg total) by mouth at bedtime. 09/13/20  Yes Marge Duncans, PA-C  megestrol (MEGACE) 20 MG tablet Take 1 tablet (20 mg total) by mouth daily. 01/04/21  Yes Marge Duncans, PA-C  metoprolol tartrate (LOPRESSOR) 25 MG tablet Take 25 mg by mouth 2 (two) times daily.   Yes [provider]  mirtazapine (REMERON) 15 MG tablet Take 15 mg by mouth at  bedtime.   Yes [provider]  Omega-3 Fatty Acids (FISH OIL) 1000 MG CAPS Take 1,000 mg by mouth daily.   Yes [provider]  OVER THE COUNTER MEDICATION Take 15 g by mouth 2 (two) times daily with a meal. Prostat Protein Supplement 15g/27m.   Yes [provider]  pantoprazole (PROTONIX) 40 MG tablet Take 40 mg by mouth daily. 06/15/20  Yes [provider]  sennosides-docusate sodium (SENOKOT-S) 8.6-50 MG tablet Take 2 tablets by mouth daily.   Yes [provider]  benzonatate (TESSALON) 100 MG capsule Take 1 capsule by mouth three times daily as needed for cough 12/11/20   DMarge Duncans PA-C  ELIQUIS 5 MG TABS tablet Take 5 mg by mouth 2 (two) times daily. 05/10/21   [provider]  lisinopril (ZESTRIL) 5 MG tablet Take 1 tablet (5 mg total) by mouth daily. Patient not taking: No sig reported 10/12/20   DMarge Duncans PA-C     Critical care time: N/A  Camala Talwar D. HKenton Kingfisher NP-C [facility] Personal contact information can be found on Amion  05/18/2021, 1:46 PM

## 2021-05-18 NOTE — Progress Notes (Signed)
Occupational Therapy Treatment Patient Details Name: Mary Carson MRN: [id number] DOB: November 14, 1945 Today's Date: 05/18/2021    History of present illness Pt is a 75yo female presenting to [facility] ED on 7/25 with chief complaint of hypoxia and weakness. Thought to be secondary to acute CHF. Xray revealed pleural effusion. CT angio negative for PE. PMH: HTN, recent diagnosis of AFIB,  anxiety, rocky mountain spotted fever.   OT comments  Pt with significant weakness and poor activity tolerance. Awakened for OT. Assisted OOB to chair. Completed UB ADL, grooming with min assist and eating with setup . Pt with stable VS on 4L 02.  Follow Up Recommendations  Home health OT;Supervision/Assistance - 24 hour    Equipment Recommendations  None recommended by OT    Recommendations for Other Services      Precautions / Restrictions Precautions Precautions: Fall Restrictions Weight Bearing Restrictions: No       Mobility Bed Mobility Overal bed mobility: Needs Assistance Bed Mobility: Supine to Sit     Supine to sit: Mod assist     General bed mobility comments: assist to raise trunk, HOB up    Transfers   Equipment used: Rolling walker (2 wheeled) Transfers: Sit to/from [facility] Sit to Stand: Min assist Stand pivot transfers: Min assist            Balance Overall balance assessment: Needs assistance Sitting-balance support: No upper extremity supported;Feet supported Sitting balance-Leahy Scale: Fair     Standing balance support: Bilateral upper extremity supported;During functional activity Standing balance-Leahy Scale: Poor                             ADL either performed or assessed with clinical judgement   ADL Overall ADL's : Needs assistance/impaired Eating/Feeding: Set up;Sitting   Grooming: Brushing hair;Oral care;Sitting;Minimal assistance Grooming Details (indicate cue type and reason): assist for thoroughness when  combing hair         Upper Body Dressing : Minimal assistance;Sitting Upper Body Dressing Details (indicate cue type and reason): front opening gown     Toilet Transfer: Minimal assistance;Stand-pivot;BSC   Toileting- Clothing Manipulation and Hygiene: Minimal assistance;Sit to/from stand;Sitting/lateral lean               Vision       Perception     Praxis      Cognition Arousal/Alertness: Awake/alert Behavior During Therapy: WFL for tasks assessed/performed Overall Cognitive Status: No family/caregiver present to determine baseline cognitive functioning Area of Impairment: Problem solving                             Problem Solving: Slow processing;Requires verbal cues          Exercises     Shoulder Instructions       General Comments      Pertinent Vitals/ Pain       Pain Assessment: No/denies pain  Home Living                                          Prior Functioning/Environment              Frequency  Min 2X/week        Progress Toward Goals  OT Goals(current goals can now be found in the care plan  section)  Progress towards OT goals: Progressing toward goals  Acute Rehab OT Goals Patient Stated Goal: to go home OT Goal Formulation: With patient Time For Goal Achievement: 05/31/21 Potential to Achieve Goals: Good  Plan Discharge plan remains appropriate    Co-evaluation                 AM-PAC OT "6 Clicks" Daily Activity     Outcome Measure   Help from another person eating meals?: A Little Help from another person taking care of personal grooming?: A Little Help from another person toileting, which includes using toliet, bedpan, or urinal?: A Little Help from another person bathing (including washing, rinsing, drying)?: A Little Help from another person to put on and taking off regular upper body clothing?: A Little Help from another person to put on and taking off regular lower body  clothing?: A Little 6 Click Score: 18    End of Session Equipment Utilized During Treatment: Oxygen  OT Visit Diagnosis: Other abnormalities of gait and mobility (R26.89);Muscle weakness (generalized) (M62.81)   Activity Tolerance Patient limited by fatigue   Patient Left in chair;with call bell/phone within reach;with chair alarm set   Nurse Communication          Time: [phone] OT Time Calculation (min): 24 min  Charges: OT General Charges $OT Visit: 1 Visit OT Treatments $Self Care/Home Management : 23-37 mins  Mary Carson, OTR/L Acute Rehabilitation Services Pager: [phone] Office: [phone]    Malka So 05/18/2021, 12:03 PM

## 2021-05-19 ENCOUNTER — Inpatient Hospital Stay (HOSPITAL_COMMUNITY): Payer: Medicare HMO

## 2021-05-19 DIAGNOSIS — I509 Heart failure, unspecified: Secondary | ICD-10-CM | POA: Diagnosis not present

## 2021-05-19 DIAGNOSIS — I5021 Acute systolic (congestive) heart failure: Secondary | ICD-10-CM | POA: Diagnosis not present

## 2021-05-19 DIAGNOSIS — E43 Unspecified severe protein-calorie malnutrition: Secondary | ICD-10-CM | POA: Diagnosis not present

## 2021-05-19 LAB — BASIC METABOLIC PANEL
Anion gap: 4 — ABNORMAL LOW (ref 5–15)
BUN: 12 mg/dL (ref 8–23)
CO2: 39 mmol/L — ABNORMAL HIGH (ref 22–32)
Calcium: 8.1 mg/dL — ABNORMAL LOW (ref 8.9–10.3)
Chloride: 90 mmol/L — ABNORMAL LOW (ref 98–111)
Creatinine, Ser: 0.34 mg/dL — ABNORMAL LOW (ref 0.44–1.00)
GFR, Estimated: >60 mL/min (ref >60)
Glucose, Bld: 93 mg/dL (ref 70–99)
Potassium: 4.4 mmol/L (ref 3.5–5.1)
Sodium: 133 mmol/L — ABNORMAL LOW (ref 135–145)

## 2021-05-19 LAB — PHOSPHORUS: Phosphorus: 2.4 mg/dL — ABNORMAL LOW (ref 2.5–4.6)

## 2021-05-19 LAB — MAGNESIUM: Magnesium: 1.9 mg/dL (ref 1.7–2.4)

## 2021-05-19 LAB — HIV ANTIBODY (ROUTINE TESTING W REFLEX): HIV Screen 4th Generation wRfx: NONREACTIVE

## 2021-05-19 MED ORDER — AZITHROMYCIN 250 MG PO TABS
500.0000 mg | ORAL_TABLET | Freq: Every day | ORAL | Status: DC
  Administered 2021-05-20 – 2021-05-22 (×3): 500 mg via ORAL
  Filled 2021-05-19 (×3): qty 2

## 2021-05-19 MED ORDER — SODIUM PHOSPHATES 45 MMOLE/15ML IV SOLN
15.0000 mmol | Freq: Once | INTRAVENOUS | Status: AC
  Administered 2021-05-19: 15 mmol via INTRAVENOUS
  Filled 2021-05-19: qty 5

## 2021-05-19 MED ORDER — POLYETHYLENE GLYCOL 3350 17 G PO PACK
17.0000 g | PACK | Freq: Three times a day (TID) | ORAL | Status: DC
  Administered 2021-05-20 – 2021-05-22 (×4): 17 g via ORAL
  Filled 2021-05-19 (×6): qty 1

## 2021-05-19 MED ORDER — POLYETHYLENE GLYCOL 3350 17 G PO PACK
17.0000 g | PACK | Freq: Every day | ORAL | Status: DC
  Administered 2021-05-19: 17 g via ORAL
  Filled 2021-05-19: qty 1

## 2021-05-19 MED ORDER — SENNA 8.6 MG PO TABS
1.0000 | ORAL_TABLET | Freq: Two times a day (BID) | ORAL | Status: DC
  Administered 2021-05-19 – 2021-05-22 (×7): 8.6 mg via ORAL
  Filled 2021-05-19 (×7): qty 1

## 2021-05-19 MED ORDER — SODIUM CHLORIDE 0.9 % IV SOLN
1.0000 g | INTRAVENOUS | Status: DC
  Administered 2021-05-20 – 2021-05-22 (×3): 1 g via INTRAVENOUS
  Filled 2021-05-19 (×3): qty 10

## 2021-05-19 MED ORDER — LEVOFLOXACIN 500 MG PO TABS
500.0000 mg | ORAL_TABLET | Freq: Every day | ORAL | Status: DC
  Administered 2021-05-19: 500 mg via ORAL
  Filled 2021-05-19: qty 1

## 2021-05-19 MED ORDER — POLYETHYLENE GLYCOL 3350 17 G PO PACK: 17.0000 g | PACK | Freq: Two times a day (BID) | ORAL | Status: DC

## 2021-05-19 MED ORDER — IOHEXOL 300 MG/ML  SOLN
75.0000 mL | Freq: Once | INTRAMUSCULAR | Status: AC | PRN
  Administered 2021-05-19: 75 mL via INTRAVENOUS

## 2021-05-19 MED ORDER — FLEET ENEMA 7-19 GM/118ML RE ENEM
1.0000 | ENEMA | Freq: Once | RECTAL | Status: AC
  Administered 2021-05-19: 1 via RECTAL
  Filled 2021-05-19: qty 1

## 2021-05-19 NOTE — Progress Notes (Signed)
Fleet enema given, pt had a small BM.

## 2021-05-19 NOTE — Progress Notes (Signed)
PROGRESS NOTE    Mary Carson  [id number] DOB: Apr 23, 1946 DOA: 05/14/2021 PCP: [name]   Brief Narrative: 75 year old with past medical history significant for hypertension, recently diagnosed with A. fib and UTI at [facility] who was at home and being seen by home health, when home health staff came by to her house they found her to be short of breath and hypoxic.  Patient was brought to the ER was found to have acute on chronic diastolic heart failure exacerbation with ongoing severe cachexia and unintentional weight loss.    Assessment & Plan:   Principal Problem:   Acute CHF (congestive heart failure) (HCC) Active Problems:   PAF (paroxysmal atrial fibrillation) (HCC)   Protein-calorie malnutrition, severe   Pleural effusion  1-Acute Hypoxic Respiratory Failure due to possible acute on chronic diastolic heart failure, new onset A. fib: PNA.  -Patient received IV Lasix 7/26, and oral lasix 7/28. -Weight: 83---87--84 Evaluate for home oxygen needs.  She is still on 3-4 L oxygen, CO2 elevated,  chloride 90. Weight down. I Discussed case with cardiology , ECHO results are not impressive, at this time we cant explain her hypoxemia to be related to HF.  Pulmonologist consulted. Plan to treat for PNA< Resp failure also component of cachexia whic has led to dyspnea and malnutrition, BL effusio due to poor oncotic pressure.  Hold lasix.   2-new diagnosis of paroxysmal A. fib: Continue with Coreg and Eliquis Echo noted with preserved ejection fraction.  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  CT abdomen Pelvis negative for mas. Ct showed significant amount of stool. Didn't had respond to enema. Nurse couldn't disimpact patient manually. GI consulted.   5-Anxiety,  depression: Continue with Celexa, ativan.   6-Hypokalemia: Replace.Hyperkalemia: resolved.   7-Right foot drop;  MRI negative for Stroke.  MRI Lumbar spine : Small right foraminal disc protrusions at L3-4 and L4-5, contacting and potentially affecting the exiting right L3 and L4 nerve roots respectively. Findings could contribute to right lower extremity symptoms. Mild to moderate right greater than left lateral recess narrowing at L4-5 related to disc bulge and facet disease. Moderate bilateral L4 and L5 foraminal stenosis related to disc bulge and facet hypertrophy. Neurosurgery consulted.    Estimated body mass index is 14.04 kg/m as calculated from the following:   Height as of this encounter: '5\' 5"'$  (1.651 m).   Weight as of this encounter: 38.3 kg.   DVT prophylaxis: Eliquis Code Status: DNR Family Communication: Son updated 7/29 Disposition Plan:  Status is: Inpatient  Remains inpatient appropriate because:IV treatments appropriate due to intensity of illness or inability to take PO  Dispo: The patient is from: Home              Anticipated d/c is to: Home              Patient currently is not medically stable to d/c. Severely constipated plan for Bowel regimen , neurosurgery consulted for disc protrusion   Difficult to place patient No        Consultants:  None  Procedures:  Echo; preserved ejection fraction  Antimicrobials:    Subjective: She report passing gas. Report cramping pain.    Objective: Vitals:   05/19/21 0011 05/19/21 0345 05/19/21 0907 05/19/21 1042  BP:  (!) 149/100 111/76 (!) 147/86  Pulse:  95 93 68  Resp:  '20 16 18  '$ Temp:  98.2 F (36.8 C) 98.5 F (36.9 C) 97.7 F (36.5 C)  TempSrc:  Oral Oral Oral  SpO2:  98% 99% 98%  Weight: 38.3 kg     Height:        Intake/Output Summary (Last 24 hours) at 05/19/2021 1510 Last data filed at 05/19/2021 1241 Gross per 24 hour  Intake 1120 ml  Output 1350 ml  Net -230 ml    Filed  Weights   05/17/21 0033 05/18/21 0010 05/19/21 0011  Weight: 39.5 kg 38.3 kg 38.3 kg    Examination:  General exam: NAD Respiratory system: CTA Cardiovascular system: S 1, S2  RRR Gastrointestinal system: BS present, soft, nt Central nervous system: alert, follows command. No focal defficti other than right foot drop.  Extremities: no edema    Data Reviewed: I have personally reviewed following labs and imaging studies  CBC: Recent Labs  Lab 05/14/21 1324 05/15/21 0307 05/16/21 0818 05/17/21 0339  WBC 10.8* 6.7 9.3 11.3*  NEUTROABS 7.8*  --   --   --   HGB 15.1* 11.1* 14.1 12.9  HCT 48.2* 35.4* 44.7 40.3  MCV 101.7* 104.4* 102.8* 102.3*  PLT 307 219 274 [id number]    Basic Metabolic Panel: Recent Labs  Lab 05/14/21 1324 05/15/21 0307 05/16/21 0818 05/17/21 0339 05/18/21 0244 05/19/21 0736  NA 133* 135 135 133* 134* 133*  K 3.4* 3.1* 4.8 4.5 5.2* 4.4  CL 86* 96* 88* 88* 90* 90*  CO2 36* 35* 39* 39* 40* 39*  GLUCOSE 128* 92 159* 103* 96 93  BUN '10 9 12 17 20 12  '$ CREATININE 0.41* 0.33* 0.48 <0.30* 0.42* 0.34*  CALCIUM 9.1 6.6* 8.6* 8.3* 8.6* 8.1*  MG 1.9  --   --   --   --  1.9  PHOS  --   --   --   --   --  2.4*    GFR: Estimated Creatinine Clearance: 36.7 mL/min (A) (by C-G formula based on SCr of 0.34 mg/dL (L)). Liver Function Tests: Recent Labs  Lab 05/14/21 1324  AST 19  ALT 18  ALKPHOS 28*  BILITOT 0.8  PROT 5.6*  ALBUMIN 3.4*    No results for input(s): LIPASE, AMYLASE in the last 168 hours. No results for input(s): AMMONIA in the last 168 hours. Coagulation Profile: No results for input(s): INR, PROTIME in the last 168 hours. Cardiac Enzymes: No results for input(s): CKTOTAL, CKMB, CKMBINDEX, TROPONINI in the last 168 hours. BNP (last 3 results) No results for input(s): PROBNP in the last 8760 hours. HbA1C: No results for input(s): HGBA1C in the last 72 hours. CBG: No results for input(s): GLUCAP in the last 168 hours. Lipid Profile: No  results for input(s): CHOL, HDL, LDLCALC, TRIG, CHOLHDL, LDLDIRECT in the last 72 hours. Thyroid Function Tests: No results for input(s): TSH, T4TOTAL, FREET4, T3FREE, THYROIDAB in the last 72 hours.  Anemia Panel: No results for input(s): VITAMINB12, FOLATE, FERRITIN, TIBC, IRON, RETICCTPCT in the last 72 hours.  Sepsis Labs: No results for input(s): PROCALCITON, LATICACIDVEN in the last 168 hours.  Recent Results (from the past 240 hour(s))  Resp Panel by RT-PCR (Flu A&B, Covid) Nasopharyngeal Swab     Status: None   Collection Time: 05/14/21  6:24 PM   Specimen: Nasopharyngeal Swab; Nasopharyngeal(NP) swabs in vial transport medium  Result Value Ref Range Status   SARS Coronavirus 2 by RT PCR NEGATIVE NEGATIVE Final  Comment: (NOTE) SARS-CoV-2 target nucleic acids are NOT DETECTED.  The SARS-CoV-2 RNA is generally detectable in upper respiratory specimens during the acute phase of infection. The lowest concentration of SARS-CoV-2 viral copies this assay can detect is 138 copies/mL. A negative result does not preclude SARS-Cov-2 infection and should not be used as the sole basis for treatment or other patient management decisions. A negative result may occur with  improper specimen collection/handling, submission of specimen other than nasopharyngeal swab, presence of viral mutation(s) within the areas targeted by this assay, and inadequate number of viral copies(<138 copies/mL). A negative result must be combined with clinical observations, patient history, and epidemiological information. The expected result is Negative.  Fact Sheet for Patients:  EntrepreneurPulse.com.au  Fact Sheet for Healthcare Providers:  IncredibleEmployment.be  This test is no t yet approved or cleared by the [place] FDA and  has been authorized for detection and/or diagnosis of SARS-CoV-2 by FDA under an Emergency Use Authorization (EUA). This EUA will  remain  in effect (meaning this test can be used) for the duration of the COVID-19 declaration under Section 564(b)(1) of the Act, 21 U.S.C.section 360bbb-3(b)(1), unless the authorization is terminated  or revoked sooner.       Influenza A by PCR NEGATIVE NEGATIVE Final   Influenza B by PCR NEGATIVE NEGATIVE Final    Comment: (NOTE) The Xpert Xpress SARS-CoV-2/FLU/RSV plus assay is intended as an aid in the diagnosis of influenza from Nasopharyngeal swab specimens and should not be used as a sole basis for treatment. Nasal washings and aspirates are unacceptable for Xpert Xpress SARS-CoV-2/FLU/RSV testing.  Fact Sheet for Patients: EntrepreneurPulse.com.au  Fact Sheet for Healthcare Providers: IncredibleEmployment.be  This test is not yet approved or cleared by the [place] FDA and has been authorized for detection and/or diagnosis of SARS-CoV-2 by FDA under an Emergency Use Authorization (EUA). This EUA will remain in effect (meaning this test can be used) for the duration of the COVID-19 declaration under Section 564(b)(1) of the Act, 21 U.S.C. section 360bbb-3(b)(1), unless the authorization is terminated or revoked.  Performed at [facility], [place] [address], [place], [place] [address]           Radiology Studies: MR LUMBAR SPINE WO CONTRAST  Result Date: 05/19/2021 CLINICAL DATA:  Initial evaluation for myelopathy, right foot drop. EXAM: MRI LUMBAR SPINE WITHOUT CONTRAST TECHNIQUE: Multiplanar, multisequence MR imaging of the lumbar spine was performed. No intravenous contrast was administered. COMPARISON:  None available. FINDINGS: Segmentation: Standard. Lowest well-formed disc space labeled the L5-S1 level. Alignment: Physiologic with preservation of the normal lumbar lordosis. No listhesis. Vertebrae: Vertebral body height maintained without acute or chronic fracture. Bone marrow signal intensity diffusely  heterogeneous without worrisome osseous lesion. No abnormal marrow edema. Conus medullaris and cauda equina: Conus extends to the L1 level. Conus and cauda equina appear normal. Paraspinal and other soft tissues: Diffuse cachexia noted within the external soft tissues. Associated chronic atrophy noted about the posterior paraspinous musculature. Paraspinous soft tissues demonstrate no acute finding. Few tiny simple cyst noted about the visualized kidneys. Partially visualized visceral structures otherwise grossly unremarkable. Disc levels: T11-12: Disc desiccation without significant disc bulge. No stenosis. T12-L1: Disc desiccation without significant disc bulge. No stenosis. L1-2: Disc desiccation without significant disc bulge. No stenosis. L2-3: Disc desiccation with mild circumferential disc bulge. Mild facet hypertrophy. No significant spinal stenosis. Foramina remain patent. L3-4: Disc desiccation with mild disc bulge. Superimposed small right foraminal disc protrusion contacts the exiting right  L3 nerve root as it courses of the right neural foramen (series 7, image 23). Associated annular fissure. Mild bilateral facet hypertrophy. Resultant mild narrowing of the lateral recesses. Central canal remains patent. Mild bilateral L3 foraminal stenosis. L4-5: Degenerative intervertebral disc space narrowing with disc desiccation and mild disc bulge. Associated mild reactive endplate spurring. Superimposed small right foraminal to extraforaminal disc protrusion contacts the exiting right L4 nerve root as it courses out of the right neural foramen (series 4, image 4). Moderate right worse than left facet hypertrophy. Resultant mild to moderate narrowing of the lateral recesses, worse on the right. Moderate bilateral L4 foraminal stenosis. L5-S1: Degenerative intervertebral disc space narrowing with disc desiccation and mild disc bulge. Associated reactive endplate spurring. Bulging disc mildly indents the ventral  thecal sac without frank neural impingement. Moderate facet hypertrophy. Lateral recesses remain patent. Moderate bilateral L5 foraminal stenosis. IMPRESSION: 1. Small right foraminal disc protrusions at L3-4 and L4-5, contacting and potentially affecting the exiting right L3 and L4 nerve roots respectively. Findings could contribute to right lower extremity symptoms. 2. Mild to moderate right greater than left lateral recess narrowing at L4-5 related to disc bulge and facet disease. 3. Moderate bilateral L4 and L5 foraminal stenosis related to disc bulge and facet hypertrophy. Electronically Signed   By: Jeannine Boga M.D.   On: 05/19/2021 00:35   CT ABDOMEN PELVIS W CONTRAST  Result Date: 05/19/2021 CLINICAL DATA:  Loss of weight. EXAM: CT ABDOMEN AND PELVIS WITH CONTRAST TECHNIQUE: Multidetector CT imaging of the abdomen and pelvis was performed using the standard protocol following bolus administration of intravenous contrast. CONTRAST:  104m OMNIPAQUE IOHEXOL 300 MG/ML  SOLN COMPARISON:  07/07/2019 FINDINGS: Lower chest: Small bilateral pleural effusion scratch set small bilateral pleural effusions identified. Mild pneumonitis within the right lower lobe with associated bronchial wall thickening identified. Hepatobiliary: Focal area of low attenuation adjacent to the falciform ligament is similar to previous exam and is most consistent with focal fatty deposition. Gallbladder appears collapsed. No bile duct dilatation. Pancreas: Unremarkable. No pancreatic ductal dilatation or surrounding inflammatory changes. Spleen: Normal in size without focal abnormality. Adrenals/Urinary Tract: Normal appearance of the adrenal glands. No suspicious mass, nephrolithiasis or hydronephrosis identified. Stomach/Bowel: Stomach appears nondistended. Mild increase caliber of the terminal ileum measuring up to 2.4 cm. No bowel wall thickening or inflammation. The appendix is not confidently identified separate from the  right lower quadrant bowel loops. There is a marked stool burden identified throughout the colon up to the level of the rectum. Large volume of desiccated stool within the rectum measures 8.3 x 9.7 by 13.8 cm (volume = 580 cm^3). Vascular/Lymphatic: Aortic atherosclerosis. No aneurysm. No abdominopelvic adenopathy identified. Reproductive: Status post hysterectomy. No adnexal masses. Other: No significant free fluid or fluid collections identified. Patient appears diffusely cachectic. 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 IMPRESSION: 1. No mass or adenopathy identified within the abdomen or pelvis. 2. Marked stool burden identified throughout the colon up to the level of the rectum. Large volume of desiccated stool within the rectum measures approximately 580 cc. Cannot exclude rectal impaction. Correlate for any clinical signs or symptoms of constipation. 3. Small bilateral pleural effusions. Mild pneumonitis within the right lower lobe may represent infection or sequelae of aspiration 4. Aortic atherosclerosis. Aortic Atherosclerosis (ICD10-I70.0). Electronically Signed   By: TKerby MoorsM.D.   On: 05/19/2021 11:33        Scheduled Meds:  apixaban  5 mg Oral BID   [START ON  05/20/2021] azithromycin  500 mg Oral Daily   citalopram  20 mg Oral Daily   feeding supplement  237 mL Oral BID BM   LORazepam  0.5 mg Oral QHS   megestrol  200 mg Oral BID   metoprolol tartrate  25 mg Oral BID   multivitamin with minerals  1 tablet Oral Daily   pantoprazole  40 mg Oral Daily   polyethylene glycol  17 g Oral Daily   senna  1 tablet Oral BID   Continuous Infusions:  [START ON 05/20/2021] cefTRIAXone (ROCEPHIN)  IV     sodium phosphate  Dextrose 5% IVPB 15 mmol (05/19/21 1423)     LOS: 4 days    Time spent: 35 minutes    Devlynn Knoff A Francia Verry, MD [facility]   If 7PM-7AM, please contact night-coverage [email or web address]  05/19/2021, 3:10 PM

## 2021-05-19 NOTE — Consult Note (Addendum)
Referring Provider:  [facility]         Primary Care Physician:  [name] Primary Gastroenterologist: Dr. Melina Copa in [place] Reason for Consultation:   Fecal impaction                ASSESSMENT / PLAN   #75 year old female with severe constipation and probable impaction on CT scan. No cancers or polyps on colonoscopy in December 2021.  --Will purge bowels with TID Miralax, Senokot and repeated enemas. Once bowels purged she will need to be on a more aggressive bowel regimen at home. Will consider Amitiza or Linzess.  --Consider discontinuation or at least reduction in home Bentyl as it can be constipating   # Unexplained weight loss of 70 pounds, unrevealing extensive evaluation ongoing as outpatient.  No malignancies seen on CT scan this admission. Normal EGD in Sept 2021. No polyps/cancers on her last colonoscopy in December 2021. On Megace.   # Atrial fibrillation, recently diagnosed. On Eliquis  # Acute hypoxic respiratory failure. Workup in progress but apparently not felt by Cardiology to be related to the acute on chronic diastolic heart failure.Plan at this time is to treat for PNA.   HISTORY OF PRESENT ILLNESS                                                                                                                         Chief Complaint: constipation  Mary Carson is a 75 y.o. female with a past medical history significant for hypertension , atrial fibrillation (new), diastolic heart failure, depression, chronic back pain  Patient was admitted 05/15/2021 with acute hypoxic respiratory failure due to acute on chronic diastolic failure. Patient has had unintentional weight loss of 70 pounds over the last several months for which she has had an extensive outpatient workup includng CXR, mammogram, head CT scan, abdominal US, cortisol level, thyroid studies.  Other than diverticulosis her colonoscopy in December 2021 was unremarkable. Prior to that she  had a normal colonoscopy in Sept 2021 for evaluation of occult GI blood loss.    This admission patient's hemoglobin has been in the normal range but she is macrocytic with an MCV of 102, platelets are normal.  On admission her albumin was 3.4 (has not been rechecked).  Remainder of LFTs unremarkable.  Lipase normal.  As part of work-up for weight loss patient had a CTAP with contrast which showed a marked stool burden throughout the colon up to the level of the rectum.  Large volume of desiccated stool in the rectum. She did not have results with an enema given earlier today    SIGNIFICANT DIAGNOTIC STUDIES     CTAP w/ constrast 05/19/21 IMPRESSION: 1. No mass or adenopathy identified within the abdomen or pelvis. 2. Marked stool burden identified throughout the colon up to the level of the rectum. Large volume of desiccated stool within the rectum measures approximately 580 cc. Cannot exclude rectal impaction. Correlate for any clinical signs  or symptoms of constipation. 3. Small bilateral pleural effusions. Mild pneumonitis within the right lower lobe may represent infection or sequelae of aspiration 4. Aortic atherosclerosis   PREVIOUS ENDOSCOPIC EVALUATIONS    Sept 2021 EGD for occult bleed - Dr. Melina Copa --normal exam  December 2021 colonoscopy with Dr. Melina Copa for occult GI bleeding.  Exam was complete, bowel prep was good.   -Findings included sigmoid diverticulosis.  Exam otherwise normal   Past Medical History:  Diagnosis Date   Anxiety    CHF (congestive heart failure) (HCC)    Dysrhythmia    RMSF [facility] spotted fever)    Squamous cell carcinoma of scalp    Vitamin D deficiency    Weakness     Past Surgical History:  Procedure Laterality Date   ABDOMINAL HYSTERECTOMY      Prior to Admission medications   Medication Sig Start Date End Date Taking? Authorizing Provider  acetaminophen (TYLENOL) 325 MG tablet Take by mouth.   Yes [provider]   alendronate (FOSAMAX) 70 MG tablet Take 70 mg by mouth once a week. 04/22/21  Yes [provider]  aspirin 81 MG EC tablet Take by mouth.   Yes [provider]  calcium carbonate (TUMS - DOSED IN MG ELEMENTAL CALCIUM) 500 MG chewable tablet Chew 1 tablet by mouth daily.   Yes [provider]  Cholecalciferol 25 MCG (1000 UT) tablet Take 1,000 Units by mouth daily.   Yes [provider]  citalopram (CELEXA) 20 MG tablet Take 1 tablet by mouth once daily Patient taking differently: Take 40 mg by mouth at bedtime. 11/21/20  Yes Rip Harbour, NP  dicyclomine (BENTYL) 10 MG capsule Take 10 mg by mouth 3 times/day as needed-between meals & bedtime for spasms.   Yes [provider]  levofloxacin (LEVAQUIN) 500 MG tablet Take 500 mg by mouth daily.   Yes [provider]  LORazepam (ATIVAN) 1 MG tablet Take 1 tablet (1 mg total) by mouth at bedtime. 09/13/20  Yes Marge Duncans, PA-C  megestrol (MEGACE) 20 MG tablet Take 1 tablet (20 mg total) by mouth daily. 01/04/21  Yes Marge Duncans, PA-C  metoprolol tartrate (LOPRESSOR) 25 MG tablet Take 25 mg by mouth 2 (two) times daily.   Yes [provider]  mirtazapine (REMERON) 15 MG tablet Take 15 mg by mouth at bedtime.   Yes [provider]  Omega-3 Fatty Acids (FISH OIL) 1000 MG CAPS Take 1,000 mg by mouth daily.   Yes [provider]  OVER THE COUNTER MEDICATION Take 15 g by mouth 2 (two) times daily with a meal. Prostat Protein Supplement 15g/8m.   Yes [provider]  pantoprazole (PROTONIX) 40 MG tablet Take 40 mg by mouth daily. 06/15/20  Yes [provider]  sennosides-docusate sodium (SENOKOT-S) 8.6-50 MG tablet Take 2 tablets by mouth daily.   Yes [provider]  benzonatate (TESSALON) 100 MG capsule Take 1 capsule by mouth three times daily as needed for cough 12/11/20   DMarge Duncans PA-C  ELIQUIS 5 MG TABS tablet Take 5 mg by mouth 2 (two) times  daily. 05/10/21   [provider]  lisinopril (ZESTRIL) 5 MG tablet Take 1 tablet (5 mg total) by mouth daily. Patient not taking: No sig reported 10/12/20   DMarge Duncans PA-C    Current Facility-Administered Medications  Medication Dose Route Frequency Provider Last Rate Last Admin   acetaminophen (TYLENOL) tablet 500 mg  500 mg Oral Q6H PRN SCandiss Norse  Margaree Mackintosh, MD   500 mg at 05/15/21 2101   apixaban (ELIQUIS) tablet 5 mg  5 mg Oral BID Rise Patience, MD   5 mg at 05/19/21 0907   [START ON 05/20/2021] azithromycin (ZITHROMAX) tablet 500 mg  500 mg Oral Daily Regalado, Belkys A, MD       [START ON 05/20/2021] cefTRIAXone (ROCEPHIN) 1 g in sodium chloride 0.9 % 100 mL IVPB  1 g Intravenous Q24H Regalado, Belkys A, MD       citalopram (CELEXA) tablet 20 mg  20 mg Oral Daily Rise Patience, MD   20 mg at 05/19/21 [id number]   feeding supplement (ENSURE ENLIVE / ENSURE PLUS) liquid 237 mL  237 mL Oral BID BM Thurnell Lose, MD   237 mL at 05/19/21 1423   LORazepam (ATIVAN) tablet 0.5 mg  0.5 mg Oral QHS Lala Lund K, MD   0.5 mg at 05/18/21 2112   megestrol (MEGACE) 400 MG/10ML suspension 200 mg  200 mg Oral BID Thurnell Lose, MD   200 mg at 05/19/21 [id number]   metoprolol tartrate (LOPRESSOR) tablet 25 mg  25 mg Oral BID Rise Patience, MD   25 mg at 05/19/21 [id number]   multivitamin with minerals tablet 1 tablet  1 tablet Oral Daily Regalado, Belkys A, MD   1 tablet at 05/19/21 0909   ondansetron (ZOFRAN) injection 4 mg  4 mg Intravenous Q6H PRN Thurnell Lose, MD       pantoprazole (PROTONIX) EC tablet 40 mg  40 mg Oral Daily Rise Patience, MD   40 mg at 05/19/21 [id number]   polyethylene glycol (MIRALAX / GLYCOLAX) packet 17 g  17 g Oral Daily Regalado, Belkys A, MD   17 g at 05/19/21 1302   senna (SENOKOT) tablet 8.6 mg  1 tablet Oral BID Regalado, Belkys A, MD       simethicone (MYLICON) 40 [id number] suspension 40 mg  40 mg Oral Q6H PRN Thurnell Lose, MD   40 mg at  05/17/21 1658   sodium phosphate 15 mmol in dextrose 5 % 250 mL infusion  15 mmol Intravenous Once Regalado, Belkys A, MD 43 mL/hr at 05/19/21 1423 15 mmol at 05/19/21 1423    Allergies as of 05/14/2021 - Review Complete 05/14/2021  Allergen Reaction Noted   Augmentin [amoxicillin-pot clavulanate]  11/24/2019   Penicillins  11/24/2019    Family History  Problem Relation Age of Onset   CAD Other     Social History   Socioeconomic History   Marital status: Widowed    Spouse name: Not on file   Number of children: 1   Years of education: Not on file   Highest education level: Not on file  Occupational History   Occupation: retired  Tobacco Use   Smoking status: Never   Smokeless tobacco: Never  Vaping Use   Vaping Use: Never used  Substance and Sexual Activity   Alcohol use: Never   Drug use: Never   Sexual activity: Not on file  Other Topics Concern   Not on file  Social History Narrative   Not on file   Social Determinants of Health   Financial Resource Strain: Not on file  Food Insecurity: Not on file  Transportation Needs: Not on file  Physical Activity: Not on file  Stress: Not on file  Social Connections: Not on file  Intimate Partner Violence: Not on file    Review of Systems: All systems reviewed and  negative except where noted in HPI.   OBJECTIVE      Physical Exam: Vital signs in last 24 hours: Temp:  [97.7 F (36.5 C)-98.5 F (36.9 C)] 97.7 F (36.5 C) (07/30 1042) Pulse Rate:  [68-95] 68 (07/30 1042) Resp:  [16-20] 18 (07/30 1042) BP: (103-149)/(52-100) 147/86 (07/30 1042) SpO2:  [96 %-99 %] 98 % (07/30 1042) Weight:  [38.3 kg] 38.3 kg (07/30 0011) Last BM Date: 05/18/21 General:   Alert  female in NAD Psych:  Pleasant, cooperative. Normal mood and affect. Eyes:  Pupils equal, sclera clear, no icterus.   Conjunctiva pink. Ears:  Normal auditory acuity. Nose:  No deformity, discharge,  or lesions. Neck:  Supple; no masses Lungs:  Clear  throughout to auscultation.   Heart:  Regular rate and rhythm Abdomen:  Soft, non-distended, nontender, BS active, no palp mass   Rectal:  Deferred  Neurologic:  Alert and  oriented x4;  grossly normal neurologically. Skin:  Intact without significant lesions or rashes.  Filed Weights   05/17/21 0033 05/18/21 0010 05/19/21 0011  Weight: 39.5 kg 38.3 kg 38.3 kg     Scheduled inpatient medications  apixaban  5 mg Oral BID   [START ON 05/20/2021] azithromycin  500 mg Oral Daily   citalopram  20 mg Oral Daily   feeding supplement  237 mL Oral BID BM   LORazepam  0.5 mg Oral QHS   megestrol  200 mg Oral BID   metoprolol tartrate  25 mg Oral BID   multivitamin with minerals  1 tablet Oral Daily   pantoprazole  40 mg Oral Daily   polyethylene glycol  17 g Oral Daily   senna  1 tablet Oral BID      Intake/Output from previous day: 07/29 0701 - 07/30 0700 In: 1077 [P.O.:1077] Out: 1300 [Urine:1300] Intake/Output this shift: Total I/O In: 640 [P.O.:640] Out: 350 [Urine:350]   Lab Results: Recent Labs    05/17/21 0339  WBC 11.3*  HGB 12.9  HCT 40.3  PLT 252   BMET Recent Labs    05/17/21 0339 05/18/21 0244 05/19/21 0736  NA 133* 134* 133*  K 4.5 5.2* 4.4  CL 88* 90* 90*  CO2 39* 40* 39*  GLUCOSE 103* 96 93  BUN '17 20 12  '$ CREATININE <0.30* 0.42* 0.34*  CALCIUM 8.3* 8.6* 8.1*   LFT No results for input(s): PROT, ALBUMIN, AST, ALT, ALKPHOS, BILITOT, BILIDIR, IBILI in the last 72 hours. PT/INR No results for input(s): LABPROT, INR in the last 72 hours. Hepatitis Panel No results for input(s): HEPBSAG, HCVAB, HEPAIGM, HEPBIGM in the last 72 hours.   . CBC Latest Ref Rng & Units 05/17/2021 05/16/2021 05/15/2021  WBC 4.0 - 10.5 K/uL 11.3(H) 9.3 6.7  Hemoglobin 12.0 - 15.0 g/dL 12.9 14.1 11.1(L)  Hematocrit 36.0 - 46.0 % 40.3 44.7 35.4(L)  Platelets 150 - 400 K/uL 252 274 219    . CMP Latest Ref Rng & Units 05/19/2021 05/18/2021 05/17/2021  Glucose 70 - 99 mg/dL  93 96 103(H)  BUN 8 - 23 mg/dL '12 20 17  '$ Creatinine 0.44 - 1.00 mg/dL 0.34(L) 0.42(L) <0.30(L)  Sodium 135 - 145 mmol/L 133(L) 134(L) 133(L)  Potassium 3.5 - 5.1 mmol/L 4.4 5.2(H) 4.5  Chloride 98 - 111 mmol/L 90(L) 90(L) 88(L)  CO2 22 - 32 mmol/L 39(H) 40(H) 39(H)  Calcium 8.9 - 10.3 mg/dL 8.1(L) 8.6(L) 8.3(L)  Total Protein 6.5 - 8.1 g/dL - - -  Total Bilirubin 0.3 - 1.2 mg/dL - - -  Alkaline Phos 38 - 126 U/L - - -  AST 15 - 41 U/L - - -  ALT 0 - 44 U/L - - -   Studies/Results: MR LUMBAR SPINE WO CONTRAST  Result Date: 05/19/2021 CLINICAL DATA:  Initial evaluation for myelopathy, right foot drop. EXAM: MRI LUMBAR SPINE WITHOUT CONTRAST TECHNIQUE: Multiplanar, multisequence MR imaging of the lumbar spine was performed. No intravenous contrast was administered. COMPARISON:  None available. FINDINGS: Segmentation: Standard. Lowest well-formed disc space labeled the L5-S1 level. Alignment: Physiologic with preservation of the normal lumbar lordosis. No listhesis. Vertebrae: Vertebral body height maintained without acute or chronic fracture. Bone marrow signal intensity diffusely heterogeneous without worrisome osseous lesion. No abnormal marrow edema. Conus medullaris and cauda equina: Conus extends to the L1 level. Conus and cauda equina appear normal. Paraspinal and other soft tissues: Diffuse cachexia noted within the external soft tissues. Associated chronic atrophy noted about the posterior paraspinous musculature. Paraspinous soft tissues demonstrate no acute finding. Few tiny simple cyst noted about the visualized kidneys. Partially visualized visceral structures otherwise grossly unremarkable. Disc levels: T11-12: Disc desiccation without significant disc bulge. No stenosis. T12-L1: Disc desiccation without significant disc bulge. No stenosis. L1-2: Disc desiccation without significant disc bulge. No stenosis. L2-3: Disc desiccation with mild circumferential disc bulge. Mild facet  hypertrophy. No significant spinal stenosis. Foramina remain patent. L3-4: Disc desiccation with mild disc bulge. Superimposed small right foraminal disc protrusion contacts the exiting right L3 nerve root as it courses of the right neural foramen (series 7, image 23). Associated annular fissure. Mild bilateral facet hypertrophy. Resultant mild narrowing of the lateral recesses. Central canal remains patent. Mild bilateral L3 foraminal stenosis. L4-5: Degenerative intervertebral disc space narrowing with disc desiccation and mild disc bulge. Associated mild reactive endplate spurring. Superimposed small right foraminal to extraforaminal disc protrusion contacts the exiting right L4 nerve root as it courses out of the right neural foramen (series 4, image 4). Moderate right worse than left facet hypertrophy. Resultant mild to moderate narrowing of the lateral recesses, worse on the right. Moderate bilateral L4 foraminal stenosis. L5-S1: Degenerative intervertebral disc space narrowing with disc desiccation and mild disc bulge. Associated reactive endplate spurring. Bulging disc mildly indents the ventral thecal sac without frank neural impingement. Moderate facet hypertrophy. Lateral recesses remain patent. Moderate bilateral L5 foraminal stenosis. IMPRESSION: 1. Small right foraminal disc protrusions at L3-4 and L4-5, contacting and potentially affecting the exiting right L3 and L4 nerve roots respectively. Findings could contribute to right lower extremity symptoms. 2. Mild to moderate right greater than left lateral recess narrowing at L4-5 related to disc bulge and facet disease. 3. Moderate bilateral L4 and L5 foraminal stenosis related to disc bulge and facet hypertrophy. Electronically Signed   By: Jeannine Boga M.D.   On: 05/19/2021 00:35   CT ABDOMEN PELVIS W CONTRAST  Result Date: 05/19/2021 CLINICAL DATA:  Loss of weight. EXAM: CT ABDOMEN AND PELVIS WITH CONTRAST TECHNIQUE: Multidetector CT  imaging of the abdomen and pelvis was performed using the standard protocol following bolus administration of intravenous contrast. CONTRAST:  68m OMNIPAQUE IOHEXOL 300 MG/ML  SOLN COMPARISON:  07/07/2019 FINDINGS: Lower chest: Small bilateral pleural effusion scratch set small bilateral pleural effusions identified. Mild pneumonitis within the right lower lobe with associated bronchial wall thickening identified. Hepatobiliary: Focal area of low attenuation adjacent to the falciform ligament is similar to previous exam and is most consistent with focal fatty deposition. Gallbladder appears collapsed. No bile duct dilatation. Pancreas: Unremarkable. No pancreatic ductal  dilatation or surrounding inflammatory changes. Spleen: Normal in size without focal abnormality. Adrenals/Urinary Tract: Normal appearance of the adrenal glands. No suspicious mass, nephrolithiasis or hydronephrosis identified. Stomach/Bowel: Stomach appears nondistended. Mild increase caliber of the terminal ileum measuring up to 2.4 cm. No bowel wall thickening or inflammation. The appendix is not confidently identified separate from the right lower quadrant bowel loops. There is a marked stool burden identified throughout the colon up to the level of the rectum. Large volume of desiccated stool within the rectum measures 8.3 x 9.7 by 13.8 cm (volume = 580 cm^3). Vascular/Lymphatic: Aortic atherosclerosis. No aneurysm. No abdominopelvic adenopathy identified. Reproductive: Status post hysterectomy. No adnexal masses. Other: No significant free fluid or fluid collections identified. Patient appears diffusely cachectic. 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 IMPRESSION: 1. No mass or adenopathy identified within the abdomen or pelvis. 2. Marked stool burden identified throughout the colon up to the level of the rectum. Large volume of desiccated stool within the rectum measures approximately 580 cc. Cannot exclude rectal  impaction. Correlate for any clinical signs or symptoms of constipation. 3. Small bilateral pleural effusions. Mild pneumonitis within the right lower lobe may represent infection or sequelae of aspiration 4. Aortic atherosclerosis. Aortic Atherosclerosis (ICD10-I70.0). Electronically Signed   By: Kerby Moors M.D.   On: 05/19/2021 11:33    Principal Problem:   Acute CHF (congestive heart failure) (HCC) Active Problems:   PAF (paroxysmal atrial fibrillation) (HCC)   Protein-calorie malnutrition, severe   Pleural effusion    Mary Savoy, NP-C @  05/19/2021, 3:15 PM    Attending physician's note   I have taken an interval history, reviewed the chart and examined the patient. I agree with the Advanced Practitioner's note, impression and recommendations.   Frail elderly female with multiple medical problems including recent A. Fib, respiratory failure, dCHF with severe constipation, rectal impaction.  No colonic obstruction.  Had neg colon Dec 2021  CT reviewed-showing large amount of stool in the right colon, large stool in the rectum without obstruction  Plan: -For now lets try gentle PO MiraLAX, enemas -No need for endoscopic disimpaction as yet -Once better, would need to be on MiraLAX once a day or Amitiza/Linzess indefinitely  Carmell Austria, MD [facility] [phone]

## 2021-05-19 NOTE — Progress Notes (Signed)
Physical Therapy Treatment Patient Details Name: Mary Carson MRN: [id number] DOB: 1946-01-11 Today's Date: 05/19/2021    History of Present Illness Pt is a 75yo female presenting [facility] ED on 7/25 with chief complaint of hypoxia and weakness. Thought to be secondary to acute CHF. Xray revealed pleural effusion. CT angio negative for PE. PMH: HTN, recent diagnosis of AFIB,  anxiety, [place] fever.    PT Comments    Pt continues to fatigues quickly and required use of supplemental O2 at 3L [place].  Plan for stair training next session as she has two steps to enter her home.  Continue to recommend HHPT.    Follow Up Recommendations  Home health PT;Supervision for mobility/OOB     Equipment Recommendations       Recommendations for Other Services       Precautions / Restrictions Precautions Precautions: Fall Restrictions Weight Bearing Restrictions: No    Mobility  Bed Mobility Overal bed mobility: Needs Assistance Bed Mobility: Rolling;Sidelying to Sit Rolling: Modified independent (Device/Increase time) Sidelying to sit: Supervision;HOB elevated   Sit to supine: Supervision   General bed mobility comments: Pt slow to move but able to perform with limited assistance.    Transfers Overall transfer level: Needs assistance Equipment used: Rolling walker (2 wheeled) Transfers: Sit to/from Stand Sit to Stand: Min guard Stand pivot transfers: Min assist       General transfer comment: Cues for sequencing and hand placement.  Ambulation/Gait Ambulation/Gait assistance: Min guard Gait Distance (Feet): 160 Feet Assistive device: Rolling walker (2 wheeled) Gait Pattern/deviations: Step-through pattern Gait velocity: decreased   General Gait Details: Cues for posture and pacing.   Stairs             Wheelchair Mobility    Modified Rankin (Stroke Patients Only)       Balance Overall balance assessment: Needs assistance Sitting-balance  support: No upper extremity supported;Feet supported Sitting balance-Leahy Scale: Good       Standing balance-Leahy Scale: Poor Standing balance comment: relies on RW in standing                            Cognition Arousal/Alertness: Awake/alert Behavior During Therapy: WFL for tasks assessed/performed Overall Cognitive Status: No family/caregiver present to determine baseline cognitive functioning Area of Impairment: Problem solving                             Problem Solving: Slow processing General Comments: pt with decreased problem sovling, and requires increased time for processing (HOH as well)      Exercises      General Comments        Pertinent Vitals/Pain Pain Assessment: No/denies pain    Home Living                      Prior Function            PT Goals (current goals can now be found in the care plan section) Acute Rehab PT Goals Patient Stated Goal: to go home Potential to Achieve Goals: Good Progress towards PT goals: Progressing toward goals    Frequency    Min 3X/week      PT Plan Current plan remains appropriate    Co-evaluation              AM-PAC PT "6 Clicks" Mobility   Outcome Measure  Help needed turning from your back to your side while in a flat bed without using bedrails?: None Help needed moving from lying on your back to sitting on the side of a flat bed without using bedrails?: A Little Help needed moving to and from a bed to a chair (including a wheelchair)?: A Little Help needed standing up from a chair using your arms (e.g., wheelchair or bedside chair)?: A Little Help needed to walk in hospital room?: A Little Help needed climbing 3-5 steps with a railing? : A Lot 6 Click Score: 18    End of Session Equipment Utilized During Treatment: Gait belt;Oxygen Activity Tolerance: Patient limited by fatigue Patient left: in bed;with call bell/phone within reach;with bed alarm  set Nurse Communication: Mobility status PT Visit Diagnosis: Muscle weakness (generalized) (M62.81);Unsteadiness on feet (R26.81);Other abnormalities of gait and mobility (R26.89)     Time: [id number] PT Time Calculation (min) (ACUTE ONLY): 18 min  Charges:  $Gait Training: 8-22 mins                     Erasmo Leventhal , PTA Acute Rehabilitation Services Pager [phone] Office [phone]    Mary Carson 05/19/2021, 2:34 PM

## 2021-05-19 NOTE — Progress Notes (Addendum)
Pt's family assisted to [facility] where pt had very small, loose BM. Family stated she had hard stool in rectum. Pt did say she felt pressure to have BM. This RN checked for impaction but did not feel any stool in rectum. Will continue to monitor.

## 2021-05-19 NOTE — Plan of Care (Signed)

## 2021-05-20 DIAGNOSIS — I5021 Acute systolic (congestive) heart failure: Secondary | ICD-10-CM | POA: Diagnosis not present

## 2021-05-20 DIAGNOSIS — E43 Unspecified severe protein-calorie malnutrition: Secondary | ICD-10-CM | POA: Diagnosis not present

## 2021-05-20 LAB — CBC
HCT: 38.6 % (ref 36.0–46.0)
Hemoglobin: 12.1 g/dL (ref 12.0–15.0)
MCH: 31.8 pg (ref 26.0–34.0)
MCHC: 31.3 g/dL (ref 30.0–36.0)
MCV: 101.3 fL — ABNORMAL HIGH (ref 80.0–100.0)
Platelets: 258 10*3/uL (ref 150–400)
RBC: 3.81 MIL/uL — ABNORMAL LOW (ref 3.87–5.11)
RDW: 12.8 % (ref 11.5–15.5)
WBC: 11.1 10*3/uL — ABNORMAL HIGH (ref 4.0–10.5)
nRBC: 0 % (ref 0.0–0.2)

## 2021-05-20 LAB — BASIC METABOLIC PANEL
Anion gap: 7 (ref 5–15)
BUN: 12 mg/dL (ref 8–23)
CO2: 33 mmol/L — ABNORMAL HIGH (ref 22–32)
Calcium: 7.7 mg/dL — ABNORMAL LOW (ref 8.9–10.3)
Chloride: 92 mmol/L — ABNORMAL LOW (ref 98–111)
Creatinine, Ser: <0.3 mg/dL — ABNORMAL LOW (ref 0.44–1.00)
Glucose, Bld: 96 mg/dL (ref 70–99)
Potassium: 4.5 mmol/L (ref 3.5–5.1)
Sodium: 132 mmol/L — ABNORMAL LOW (ref 135–145)

## 2021-05-20 LAB — PHOSPHORUS: Phosphorus: 2.7 mg/dL (ref 2.5–4.6)

## 2021-05-20 MED ORDER — BISACODYL 5 MG PO TBEC
5.0000 mg | DELAYED_RELEASE_TABLET | Freq: Once | ORAL | Status: AC
  Administered 2021-05-20: 5 mg via ORAL
  Filled 2021-05-20: qty 1

## 2021-05-20 MED ORDER — FLEET ENEMA 7-19 GM/118ML RE ENEM
1.0000 | ENEMA | Freq: Every day | RECTAL | Status: DC | PRN
  Filled 2021-05-20: qty 1

## 2021-05-20 NOTE — Progress Notes (Addendum)
PROGRESS NOTE    Mary Carson  [id number] DOB: Jul 11, 1946 DOA: 05/14/2021 PCP: [name]   Brief Narrative: 75 year old with past medical history significant for hypertension, recently diagnosed with A. fib and UTI at [facility] who was at home and being seen by home health, when home health staff came by to her house they found her to be short of breath and hypoxic.  Patient was brought to the ER was found to have acute on chronic diastolic heart failure exacerbation with ongoing severe cachexia and unintentional weight loss.    Assessment & Plan:   Principal Problem:   Acute CHF (congestive heart failure) (HCC) Active Problems:   PAF (paroxysmal atrial fibrillation) (HCC)   Protein-calorie malnutrition, severe   Pleural effusion  1-Acute Hypoxic Respiratory Failure due to possible BL pleural effusion from hypoalbuminemia, new onset A. Fib, PNA.  -Patient received IV Lasix 7/26, and oral lasix 7/28. -Weight: 83---87--84 Evaluate for home oxygen needs.  She is still on 3-4 L oxygen, CO2 elevated,  chloride 90. Weight down. I Discussed case with cardiology , ECHO results are not impressive, at this time we cant explain her hypoxemia to be related to HF.  Pulmonologist consulted. Plan to treat for PNA< Resp failure also component of cachexia whic has led to dyspnea and malnutrition, BL effusio due to poor oncotic pressure.  Hold lasix.  Stable, she will nee home oxygen.   2-new diagnosis of paroxysmal A. fib: Continue with Coreg and Eliquis Echo noted with preserved ejection fraction.  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  CT abdomen Pelvis negative for mas. Ct showed significant amount of stool. Didn't had respond to enema. Nurse couldn't disimpact patient  manually. GI consulted.   5-Severe Constipation, fecal impaction:  Received Soap Sud enema yesterday. Plan for enema today.  Continue with Miralax TID.  She will need laxative, schedule at discharge.  Per family patient has been taking miralax at home and senna. Will probably need to add Linzess at discharge.   Anxiety, depression: Continue with Celexa, ativan.   6-Hypokalemia: Replace.Hyperkalemia: resolved.   7-Right foot drop;  MRI negative for Stroke.  MRI Lumbar spine : Small right foraminal disc protrusions at L3-4 and L4-5, contacting and potentially affecting the exiting right L3 and L4 nerve roots respectively. Findings could contribute to right lower extremity symptoms. Mild to moderate right greater than left lateral recess narrowing at L4-5 related to disc bulge and facet disease. Moderate bilateral L4 and L5 foraminal stenosis related to disc bulge and facet hypertrophy. Discussed with Dr Marcello Moores from neurosurgery. MRI unlikely to explain foot drop, disc protrusion is very small. Patient will need follow up with neurologist for nerve conduction test.    Estimated body mass index is 14.31 kg/m as calculated from the following:   Height as of this encounter: '5\' 5"'$  (1.651 m).   Weight as of this encounter: 39 kg.   DVT prophylaxis: Eliquis Code Status: DNR Family Communication: Son updated 7/29 Disposition Plan:  Status is: Inpatient  Remains inpatient appropriate because:IV treatments appropriate due to intensity of illness or inability to take PO  Dispo: The patient is from: Home              Anticipated d/c is to: Home  Patient currently is not medically stable to d/c. Severely constipated plan for Bowel regimen , neurosurgery consulted for disc protrusion   Difficult to place patient No        Consultants:  None  Procedures:  Echo; preserved ejection fraction  Antimicrobials:    Subjective: She had BM yesterday. One BM today.  She is  feeling better.    Objective: Vitals:   05/19/21 2035 05/20/21 0449 05/20/21 0905 05/20/21 1116  BP: 137/86 135/78 113/71 120/69  Pulse: 95 81 86 96  Resp: 18 14 (!) 22 16  Temp: 98.3 F (36.8 C) 98.1 F (36.7 C) 98.2 F (36.8 C) 97.6 F (36.4 C)  TempSrc: Oral Oral Oral Oral  SpO2: 99% 100% 100% 99%  Weight:  39 kg    Height:        Intake/Output Summary (Last 24 hours) at 05/20/2021 1438 Last data filed at 05/20/2021 1341 Gross per 24 hour  Intake 330 ml  Output --  Net 330 ml    Filed Weights   05/18/21 0010 05/19/21 0011 05/20/21 0449  Weight: 38.3 kg 38.3 kg 39 kg    Examination:  General exam: NAD Respiratory system: CTA Cardiovascular system:  S 1, S 2 RRR Gastrointestinal system: BS present, soft, nt Central nervous system: Alert, follows command.No focal defficti other than right foot drop.  Extremities: No edema    Data Reviewed: I have personally reviewed following labs and imaging studies  CBC: Recent Labs  Lab 05/14/21 1324 05/15/21 0307 05/16/21 0818 05/17/21 0339 05/20/21 0306  WBC 10.8* 6.7 9.3 11.3* 11.1*  NEUTROABS 7.8*  --   --   --   --   HGB 15.1* 11.1* 14.1 12.9 12.1  HCT 48.2* 35.4* 44.7 40.3 38.6  MCV 101.7* 104.4* 102.8* 102.3* 101.3*  PLT 307 219 274 252 0000000    Basic Metabolic Panel: Recent Labs  Lab 05/14/21 1324 05/15/21 0307 05/16/21 0818 05/17/21 0339 05/18/21 0244 05/19/21 0736 05/20/21 0306 05/20/21 0808  NA 133*   < > 135 133* 134* 133* 132*  --   K 3.4*   < > 4.8 4.5 5.2* 4.4 4.5  --   CL 86*   < > 88* 88* 90* 90* 92*  --   CO2 36*   < > 39* 39* 40* 39* 33*  --   GLUCOSE 128*   < > 159* 103* 96 93 96  --   BUN 10   < > '12 17 20 12 12  '$ --   CREATININE 0.41*   < > 0.48 <0.30* 0.42* 0.34* <0.30*  --   CALCIUM 9.1   < > 8.6* 8.3* 8.6* 8.1* 7.7*  --   MG 1.9  --   --   --   --  1.9  --   --   PHOS  --   --   --   --   --  2.4*  --  2.7   < > = values in this interval not displayed.    GFR: CrCl cannot be  calculated (This lab value cannot be used to calculate CrCl because it is not a number: <0.30). Liver Function Tests: Recent Labs  Lab 05/14/21 1324  AST 19  ALT 18  ALKPHOS 28*  BILITOT 0.8  PROT 5.6*  ALBUMIN 3.4*    No results for input(s): LIPASE, AMYLASE in the last 168 hours. No results for input(s): AMMONIA in the last 168 hours. Coagulation Profile: No results for input(s): INR, PROTIME  in the last 168 hours. Cardiac Enzymes: No results for input(s): CKTOTAL, CKMB, CKMBINDEX, TROPONINI in the last 168 hours. BNP (last 3 results) No results for input(s): PROBNP in the last 8760 hours. HbA1C: No results for input(s): HGBA1C in the last 72 hours. CBG: No results for input(s): GLUCAP in the last 168 hours. Lipid Profile: No results for input(s): CHOL, HDL, LDLCALC, TRIG, CHOLHDL, LDLDIRECT in the last 72 hours. Thyroid Function Tests: No results for input(s): TSH, T4TOTAL, FREET4, T3FREE, THYROIDAB in the last 72 hours.  Anemia Panel: No results for input(s): VITAMINB12, FOLATE, FERRITIN, TIBC, IRON, RETICCTPCT in the last 72 hours.  Sepsis Labs: No results for input(s): PROCALCITON, LATICACIDVEN in the last 168 hours.  Recent Results (from the past 240 hour(s))  Resp Panel by RT-PCR (Flu A&B, Covid) Nasopharyngeal Swab     Status: None   Collection Time: 05/14/21  6:24 PM   Specimen: Nasopharyngeal Swab; Nasopharyngeal(NP) swabs in vial transport medium  Result Value Ref Range Status   SARS Coronavirus 2 by RT PCR NEGATIVE NEGATIVE Final    Comment: (NOTE) SARS-CoV-2 target nucleic acids are NOT DETECTED.  The SARS-CoV-2 RNA is generally detectable in upper respiratory specimens during the acute phase of infection. The lowest concentration of SARS-CoV-2 viral copies this assay can detect is 138 copies/mL. A negative result does not preclude SARS-Cov-2 infection and should not be used as the sole basis for treatment or other patient management decisions. A  negative result may occur with  improper specimen collection/handling, submission of specimen other than nasopharyngeal swab, presence of viral mutation(s) within the areas targeted by this assay, and inadequate number of viral copies(<138 copies/mL). A negative result must be combined with clinical observations, patient history, and epidemiological information. The expected result is Negative.  Fact Sheet for Patients:  EntrepreneurPulse.com.au  Fact Sheet for Healthcare Providers:  IncredibleEmployment.be  This test is no t yet approved or cleared by the [place] FDA and  has been authorized for detection and/or diagnosis of SARS-CoV-2 by FDA under an Emergency Use Authorization (EUA). This EUA will remain  in effect (meaning this test can be used) for the duration of the COVID-19 declaration under Section 564(b)(1) of the Act, 21 U.S.C.section 360bbb-3(b)(1), unless the authorization is terminated  or revoked sooner.       Influenza A by PCR NEGATIVE NEGATIVE Final   Influenza B by PCR NEGATIVE NEGATIVE Final    Comment: (NOTE) The Xpert Xpress SARS-CoV-2/FLU/RSV plus assay is intended as an aid in the diagnosis of influenza from Nasopharyngeal swab specimens and should not be used as a sole basis for treatment. Nasal washings and aspirates are unacceptable for Xpert Xpress SARS-CoV-2/FLU/RSV testing.  Fact Sheet for Patients: EntrepreneurPulse.com.au  Fact Sheet for Healthcare Providers: IncredibleEmployment.be  This test is not yet approved or cleared by the [place] FDA and has been authorized for detection and/or diagnosis of SARS-CoV-2 by FDA under an Emergency Use Authorization (EUA). This EUA will remain in effect (meaning this test can be used) for the duration of the COVID-19 declaration under Section 564(b)(1) of the Act, 21 U.S.C. section 360bbb-3(b)(1), unless the authorization  is terminated or revoked.  Performed at [facility], [place] [address] [place], [place] [address]           Radiology Studies: MR LUMBAR SPINE WO CONTRAST  Result Date: 05/19/2021 CLINICAL DATA:  Initial evaluation for myelopathy, right foot drop. EXAM: MRI LUMBAR SPINE WITHOUT CONTRAST TECHNIQUE: Multiplanar, multisequence MR imaging of the  lumbar spine was performed. No intravenous contrast was administered. COMPARISON:  None available. FINDINGS: Segmentation: Standard. Lowest well-formed disc space labeled the L5-S1 level. Alignment: Physiologic with preservation of the normal lumbar lordosis. No listhesis. Vertebrae: Vertebral body height maintained without acute or chronic fracture. Bone marrow signal intensity diffusely heterogeneous without worrisome osseous lesion. No abnormal marrow edema. Conus medullaris and cauda equina: Conus extends to the L1 level. Conus and cauda equina appear normal. Paraspinal and other soft tissues: Diffuse cachexia noted within the external soft tissues. Associated chronic atrophy noted about the posterior paraspinous musculature. Paraspinous soft tissues demonstrate no acute finding. Few tiny simple cyst noted about the visualized kidneys. Partially visualized visceral structures otherwise grossly unremarkable. Disc levels: T11-12: Disc desiccation without significant disc bulge. No stenosis. T12-L1: Disc desiccation without significant disc bulge. No stenosis. L1-2: Disc desiccation without significant disc bulge. No stenosis. L2-3: Disc desiccation with mild circumferential disc bulge. Mild facet hypertrophy. No significant spinal stenosis. Foramina remain patent. L3-4: Disc desiccation with mild disc bulge. Superimposed small right foraminal disc protrusion contacts the exiting right L3 nerve root as it courses of the right neural foramen (series 7, image 23). Associated annular fissure. Mild bilateral facet hypertrophy. Resultant mild narrowing of the  lateral recesses. Central canal remains patent. Mild bilateral L3 foraminal stenosis. L4-5: Degenerative intervertebral disc space narrowing with disc desiccation and mild disc bulge. Associated mild reactive endplate spurring. Superimposed small right foraminal to extraforaminal disc protrusion contacts the exiting right L4 nerve root as it courses out of the right neural foramen (series 4, image 4). Moderate right worse than left facet hypertrophy. Resultant mild to moderate narrowing of the lateral recesses, worse on the right. Moderate bilateral L4 foraminal stenosis. L5-S1: Degenerative intervertebral disc space narrowing with disc desiccation and mild disc bulge. Associated reactive endplate spurring. Bulging disc mildly indents the ventral thecal sac without frank neural impingement. Moderate facet hypertrophy. Lateral recesses remain patent. Moderate bilateral L5 foraminal stenosis. IMPRESSION: 1. Small right foraminal disc protrusions at L3-4 and L4-5, contacting and potentially affecting the exiting right L3 and L4 nerve roots respectively. Findings could contribute to right lower extremity symptoms. 2. Mild to moderate right greater than left lateral recess narrowing at L4-5 related to disc bulge and facet disease. 3. Moderate bilateral L4 and L5 foraminal stenosis related to disc bulge and facet hypertrophy. Electronically Signed   By: Jeannine Boga M.D.   On: 05/19/2021 00:35   CT ABDOMEN PELVIS W CONTRAST  Result Date: 05/19/2021 CLINICAL DATA:  Loss of weight. EXAM: CT ABDOMEN AND PELVIS WITH CONTRAST TECHNIQUE: Multidetector CT imaging of the abdomen and pelvis was performed using the standard protocol following bolus administration of intravenous contrast. CONTRAST:  29m OMNIPAQUE IOHEXOL 300 MG/ML  SOLN COMPARISON:  07/07/2019 FINDINGS: Lower chest: Small bilateral pleural effusion scratch set small bilateral pleural effusions identified. Mild pneumonitis within the right lower lobe  with associated bronchial wall thickening identified. Hepatobiliary: Focal area of low attenuation adjacent to the falciform ligament is similar to previous exam and is most consistent with focal fatty deposition. Gallbladder appears collapsed. No bile duct dilatation. Pancreas: Unremarkable. No pancreatic ductal dilatation or surrounding inflammatory changes. Spleen: Normal in size without focal abnormality. Adrenals/Urinary Tract: Normal appearance of the adrenal glands. No suspicious mass, nephrolithiasis or hydronephrosis identified. Stomach/Bowel: Stomach appears nondistended. Mild increase caliber of the terminal ileum measuring up to 2.4 cm. No bowel wall thickening or inflammation. The appendix is not confidently identified separate from the right lower quadrant bowel  loops. There is a marked stool burden identified throughout the colon up to the level of the rectum. Large volume of desiccated stool within the rectum measures 8.3 x 9.7 by 13.8 cm (volume = 580 cm^3). Vascular/Lymphatic: Aortic atherosclerosis. No aneurysm. No abdominopelvic adenopathy identified. Reproductive: Status post hysterectomy. No adnexal masses. Other: No significant free fluid or fluid collections identified. Patient appears diffusely cachectic. 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 IMPRESSION: 1. No mass or adenopathy identified within the abdomen or pelvis. 2. Marked stool burden identified throughout the colon up to the level of the rectum. Large volume of desiccated stool within the rectum measures approximately 580 cc. Cannot exclude rectal impaction. Correlate for any clinical signs or symptoms of constipation. 3. Small bilateral pleural effusions. Mild pneumonitis within the right lower lobe may represent infection or sequelae of aspiration 4. Aortic atherosclerosis. Aortic Atherosclerosis (ICD10-I70.0). Electronically Signed   By: Kerby Moors M.D.   On: 05/19/2021 11:33        Scheduled  Meds:  apixaban  5 mg Oral BID   azithromycin  500 mg Oral Daily   citalopram  20 mg Oral Daily   feeding supplement  237 mL Oral BID BM   LORazepam  0.5 mg Oral QHS   megestrol  200 mg Oral BID   metoprolol tartrate  25 mg Oral BID   multivitamin with minerals  1 tablet Oral Daily   pantoprazole  40 mg Oral Daily   polyethylene glycol  17 g Oral TID   senna  1 tablet Oral BID   Continuous Infusions:  cefTRIAXone (ROCEPHIN)  IV 1 g (05/20/21 1232)     LOS: 5 days    Time spent: 35 minutes    Laquinda Moller A Jacorion Klem, MD [facility]   If 7PM-7AM, please contact night-coverage [email or web address]  05/20/2021, 2:38 PM

## 2021-05-20 NOTE — Progress Notes (Signed)
Progress Note    ASSESSMENT AND PLAN:   Severe constipation with rectal impaction. Neg colon 09/2020   Plan: -Need another soapsuds enema later today. -Continue MiraLAX twice daily.  Once with good bowel movements, can decrease it to once a day. -We will sign off for now.     SUBJECTIVE   Fairly good results with enema. Patient does feel better. No abdominal pain. No rectal bleeding.    OBJECTIVE:     Vital signs in last 24 hours: Temp:  [97.6 F (36.4 C)-98.3 F (36.8 C)] 97.6 F (36.4 C) (07/31 1116) Pulse Rate:  [81-96] 96 (07/31 1116) Resp:  [14-22] 16 (07/31 1116) BP: (113-137)/(69-86) 120/69 (07/31 1116) SpO2:  [99 %-100 %] 99 % (07/31 1116) Weight:  [39 kg] 39 kg (07/31 0449) Last BM Date: 05/18/21 General:   Alert, frail female Abdomen:  Soft, nondistended, nontender.  Normal bowel sounds,.       Psych:  Pleasant, cooperative.  Normal mood and affect.   Intake/Output from previous day: 07/30 0701 - 07/31 0700 In: 760 [P.O.:760] Out: 350 [Urine:350] Intake/Output this shift: Total I/O In: 180 [P.O.:180] Out: -   Lab Results: Recent Labs    05/20/21 0306  WBC 11.1*  HGB 12.1  HCT 38.6  PLT 258   BMET Recent Labs    05/18/21 0244 05/19/21 0736 05/20/21 0306  NA 134* 133* 132*  K 5.2* 4.4 4.5  CL 90* 90* 92*  CO2 40* 39* 33*  GLUCOSE 96 93 96  BUN '20 12 12  '$ CREATININE 0.42* 0.34* <0.30*  CALCIUM 8.6* 8.1* 7.7*   LFT No results for input(s): PROT, ALBUMIN, AST, ALT, ALKPHOS, BILITOT, BILIDIR, IBILI in the last 72 hours. PT/INR No results for input(s): LABPROT, INR in the last 72 hours. Hepatitis Panel No results for input(s): HEPBSAG, HCVAB, HEPAIGM, HEPBIGM in the last 72 hours.  MR LUMBAR SPINE WO CONTRAST  Result Date: 05/19/2021 CLINICAL DATA:  Initial evaluation for myelopathy, right foot drop. EXAM: MRI LUMBAR SPINE WITHOUT CONTRAST TECHNIQUE: Multiplanar, multisequence MR imaging of the lumbar spine was  performed. No intravenous contrast was administered. COMPARISON:  None available. FINDINGS: Segmentation: Standard. Lowest well-formed disc space labeled the L5-S1 level. Alignment: Physiologic with preservation of the normal lumbar lordosis. No listhesis. Vertebrae: Vertebral body height maintained without acute or chronic fracture. Bone marrow signal intensity diffusely heterogeneous without worrisome osseous lesion. No abnormal marrow edema. Conus medullaris and cauda equina: Conus extends to the L1 level. Conus and cauda equina appear normal. Paraspinal and other soft tissues: Diffuse cachexia noted within the external soft tissues. Associated chronic atrophy noted about the posterior paraspinous musculature. Paraspinous soft tissues demonstrate no acute finding. Few tiny simple cyst noted about the visualized kidneys. Partially visualized visceral structures otherwise grossly unremarkable. Disc levels: T11-12: Disc desiccation without significant disc bulge. No stenosis. T12-L1: Disc desiccation without significant disc bulge. No stenosis. L1-2: Disc desiccation without significant disc bulge. No stenosis. L2-3: Disc desiccation with mild circumferential disc bulge. Mild facet hypertrophy. No significant spinal stenosis. Foramina remain patent. L3-4: Disc desiccation with mild disc bulge. Superimposed small right foraminal disc protrusion contacts the exiting right L3 nerve root as it courses of the right neural foramen (series 7, image 23). Associated annular fissure. Mild bilateral facet hypertrophy. Resultant mild narrowing of the lateral recesses. Central canal remains patent. Mild bilateral L3 foraminal stenosis. L4-5: Degenerative intervertebral disc space narrowing with disc desiccation and mild disc bulge. Associated mild reactive endplate spurring. Superimposed  small right foraminal to extraforaminal disc protrusion contacts the exiting right L4 nerve root as it courses out of the right neural foramen  (series 4, image 4). Moderate right worse than left facet hypertrophy. Resultant mild to moderate narrowing of the lateral recesses, worse on the right. Moderate bilateral L4 foraminal stenosis. L5-S1: Degenerative intervertebral disc space narrowing with disc desiccation and mild disc bulge. Associated reactive endplate spurring. Bulging disc mildly indents the ventral thecal sac without frank neural impingement. Moderate facet hypertrophy. Lateral recesses remain patent. Moderate bilateral L5 foraminal stenosis. IMPRESSION: 1. Small right foraminal disc protrusions at L3-4 and L4-5, contacting and potentially affecting the exiting right L3 and L4 nerve roots respectively. Findings could contribute to right lower extremity symptoms. 2. Mild to moderate right greater than left lateral recess narrowing at L4-5 related to disc bulge and facet disease. 3. Moderate bilateral L4 and L5 foraminal stenosis related to disc bulge and facet hypertrophy. Electronically Signed   By: Jeannine Boga M.D.   On: 05/19/2021 00:35   CT ABDOMEN PELVIS W CONTRAST  Result Date: 05/19/2021 CLINICAL DATA:  Loss of weight. EXAM: CT ABDOMEN AND PELVIS WITH CONTRAST TECHNIQUE: Multidetector CT imaging of the abdomen and pelvis was performed using the standard protocol following bolus administration of intravenous contrast. CONTRAST:  10m OMNIPAQUE IOHEXOL 300 MG/ML  SOLN COMPARISON:  07/07/2019 FINDINGS: Lower chest: Small bilateral pleural effusion scratch set small bilateral pleural effusions identified. Mild pneumonitis within the right lower lobe with associated bronchial wall thickening identified. Hepatobiliary: Focal area of low attenuation adjacent to the falciform ligament is similar to previous exam and is most consistent with focal fatty deposition. Gallbladder appears collapsed. No bile duct dilatation. Pancreas: Unremarkable. No pancreatic ductal dilatation or surrounding inflammatory changes. Spleen: Normal in size  without focal abnormality. Adrenals/Urinary Tract: Normal appearance of the adrenal glands. No suspicious mass, nephrolithiasis or hydronephrosis identified. Stomach/Bowel: Stomach appears nondistended. Mild increase caliber of the terminal ileum measuring up to 2.4 cm. No bowel wall thickening or inflammation. The appendix is not confidently identified separate from the right lower quadrant bowel loops. There is a marked stool burden identified throughout the colon up to the level of the rectum. Large volume of desiccated stool within the rectum measures 8.3 x 9.7 by 13.8 cm (volume = 580 cm^3). Vascular/Lymphatic: Aortic atherosclerosis. No aneurysm. No abdominopelvic adenopathy identified. Reproductive: Status post hysterectomy. No adnexal masses. Other: No significant free fluid or fluid collections identified. Patient appears diffusely cachectic. 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 IMPRESSION: 1. No mass or adenopathy identified within the abdomen or pelvis. 2. Marked stool burden identified throughout the colon up to the level of the rectum. Large volume of desiccated stool within the rectum measures approximately 580 cc. Cannot exclude rectal impaction. Correlate for any clinical signs or symptoms of constipation. 3. Small bilateral pleural effusions. Mild pneumonitis within the right lower lobe may represent infection or sequelae of aspiration 4. Aortic atherosclerosis. Aortic Atherosclerosis (ICD10-I70.0). Electronically Signed   By: TKerby MoorsM.D.   On: 05/19/2021 11:33     Principal Problem:   Acute CHF (congestive heart failure) (HCC) Active Problems:   PAF (paroxysmal atrial fibrillation) (HCC)   Protein-calorie malnutrition, severe   Pleural effusion     LOS: 5 days     RCarmell Austria MD 05/20/2021, 11:57 AM LVelora HecklerGI [phone]

## 2021-05-20 NOTE — Progress Notes (Signed)
Soap sud's enema given at this time with moderate results.

## 2021-05-20 NOTE — Significant Event (Signed)
Soap suds enema given poorly tolerated sitting on bedside commode at the moment.

## 2021-05-20 NOTE — Plan of Care (Signed)

## 2021-05-21 ENCOUNTER — Inpatient Hospital Stay (HOSPITAL_COMMUNITY): Payer: Medicare HMO

## 2021-05-21 DIAGNOSIS — I5021 Acute systolic (congestive) heart failure: Secondary | ICD-10-CM | POA: Diagnosis not present

## 2021-05-21 LAB — BASIC METABOLIC PANEL
Anion gap: 5 (ref 5–15)
BUN: 13 mg/dL (ref 8–23)
CO2: 39 mmol/L — ABNORMAL HIGH (ref 22–32)
Calcium: 8.2 mg/dL — ABNORMAL LOW (ref 8.9–10.3)
Chloride: 91 mmol/L — ABNORMAL LOW (ref 98–111)
Creatinine, Ser: 0.39 mg/dL — ABNORMAL LOW (ref 0.44–1.00)
GFR, Estimated: >60 mL/min (ref >60)
Glucose, Bld: 164 mg/dL — ABNORMAL HIGH (ref 70–99)
Potassium: 4.5 mmol/L (ref 3.5–5.1)
Sodium: 135 mmol/L (ref 135–145)

## 2021-05-21 MED ORDER — LINACLOTIDE 145 MCG PO CAPS
145.0000 ug | ORAL_CAPSULE | Freq: Every day | ORAL | Status: DC
  Administered 2021-05-22: 145 ug via ORAL
  Filled 2021-05-21 (×2): qty 1

## 2021-05-21 MED ORDER — BISACODYL 5 MG PO TBEC
5.0000 mg | DELAYED_RELEASE_TABLET | Freq: Once | ORAL | Status: AC
  Administered 2021-05-21: 5 mg via ORAL
  Filled 2021-05-21: qty 1

## 2021-05-21 MED ORDER — LACTULOSE 10 GM/15ML PO SOLN
30.0000 g | Freq: Once | ORAL | Status: DC
  Filled 2021-05-21: qty 45

## 2021-05-21 NOTE — Progress Notes (Signed)
Fleet's enema given with small amount of mushy brown stool result. Pt tolerated fair with no c/o abd pain.

## 2021-05-21 NOTE — Progress Notes (Signed)
Physical Therapy Treatment Patient Details Name: Mary Carson MRN: [id number] DOB: [birth year] Today's Date: 05/21/2021    History of Present Illness Pt is a 76yo female presenting to [facility] ED on 7/25 with chief complaint of hypoxia and weakness. Thought to be secondary to acute CHF. Xray revealed pleural effusion. CT angio negative for PE. PMH: HTN, recent diagnosis of AFIB,  anxiety, rocky mountain spotted fever.    PT Comments    Per RN patient has been having loose stool and up to [facility] several times. Patient is agreeable to PT session.  She performed bed exercises then performed supine to sit with mod assist. Upon sitting up on edge of bed patient reports she is not feeling well. Was assisted back to supine at that time. Patient will continue to benefit from skilled PT while here to improve strength and functional independence.      Follow Up Recommendations  Home health PT;Supervision for mobility/OOB     Equipment Recommendations  None recommended by PT    Recommendations for Other Services       Precautions / Restrictions Precautions Precautions: Fall Restrictions Weight Bearing Restrictions: No    Mobility  Bed Mobility Overal bed mobility: Needs Assistance Bed Mobility: Supine to Sit;Sit to Supine     Supine to sit: Mod assist Sit to supine: Mod assist   General bed mobility comments: Patient attempted to get up to sitting mod independently, but was unable. Required mod assist to raise trunk.    Transfers                 General transfer comment: upon istting up patient reports she is feeling bad. Returned to supine  Ambulation/Gait                 Stairs             Wheelchair Mobility    Modified Rankin (Stroke Patients Only)       Balance Overall balance assessment: Needs assistance Sitting-balance support: Feet supported Sitting balance-Leahy Scale: Good                                      Cognition  Arousal/Alertness: Awake/alert;Lethargic Behavior During Therapy: WFL for tasks assessed/performed Overall Cognitive Status: No family/caregiver present to determine baseline cognitive functioning Area of Impairment: Safety/judgement;Awareness;Problem solving                         Safety/Judgement: Decreased awareness of deficits Awareness: Intellectual Problem Solving: Slow processing General Comments: pt with decreased problem sovling, and requires increased time for processing [facility] as well)      Exercises General Exercises - Upper Extremity Shoulder Flexion: Strengthening;Both;10 reps;Supine;Theraband (Verbal and tc's required for positioning and proper follow through. Increased time for processing & performance) Shoulder ABduction: Both;10 reps;Supine;Theraband;Other (comment);Strengthening (Verbal and tc's required for positioning and proper follow through. Increased time for processing and performance) Elbow Flexion: Strengthening;Both;10 reps;Supine;Other (comment) (Verbal and tc's required for positioning and proper follow through. Increased time for processing. Increased time) Elbow Extension: Strengthening;Both;10 reps;Supine;Theraband;Other (comment) (Verbal and tc's required for positioning and proper follow through. Increased time for processing. Increased time for completetion) Other Exercises Other Exercises: Supine LE exercises: ap ( has no active DF on right LE), heel slides, hip abd/add, SLR x 7-10 reps as able    General Comments        Pertinent  Vitals/Pain Pain Assessment: No/denies pain    Home Living                      Prior Function            PT Goals (current goals can now be found in the care plan section) Acute Rehab PT Goals Patient Stated Goal: feel better PT Goal Formulation: With patient Time For Goal Achievement: 05/30/21 Potential to Achieve Goals: Fair Progress towards PT goals: Progressing toward goals     Frequency    Min 3X/week      PT Plan Current plan remains appropriate    Co-evaluation              AM-PAC PT "6 Clicks" Mobility   Outcome Measure  Help needed turning from your back to your side while in a flat bed without using bedrails?: A Little Help needed moving from lying on your back to sitting on the side of a flat bed without using bedrails?: A Little Help needed moving to and from a bed to a chair (including a wheelchair)?: A Little Help needed standing up from a chair using your arms (e.g., wheelchair or bedside chair)?: A Little Help needed to walk in hospital room?: A Little Help needed climbing 3-5 steps with a railing? : A Lot 6 Click Score: 17    End of Session Equipment Utilized During Treatment: Oxygen Activity Tolerance: Patient limited by fatigue Patient left: in bed;with call bell/phone within reach Nurse Communication: Mobility status PT Visit Diagnosis: Muscle weakness (generalized) (M62.81);Other abnormalities of gait and mobility (R26.89)     Time: [id number] PT Time Calculation (min) (ACUTE ONLY): 12 min  Charges:  $Therapeutic Exercise: 8-22 mins                    Mikiah Durall, PT, GCS 05/21/21,1:34 PM

## 2021-05-21 NOTE — Significant Event (Signed)
Pt son would like to explore short stay rehab options for she is at home alone. And is still weak.

## 2021-05-21 NOTE — Significant Event (Signed)
Patient is week with needing x1 contact assistance. Pt is up to the Bedside commode with stress incontinence on the way there. Brown Medium size loose stool assessed.

## 2021-05-21 NOTE — Care Management Important Message (Signed)
Important Message  Patient Details  Name: Mary Carson MRN: [id number] Date of Birth: [birth year]   Medicare Important Message Given:  Yes     Shelda Altes 05/21/2021, 9:56 AM

## 2021-05-21 NOTE — Progress Notes (Signed)
   05/21/21 1000  Pain Assessment  Pain Assessment No/denies pain  Mobility  Activity Refused mobility (Patient declined for unspecified reasons)

## 2021-05-21 NOTE — Progress Notes (Signed)
Patient is currently trying to drink the lactulose states that its too strong however she says well its doctors orders so I will try (with a smile)" Writer is optimistic that patient will follow through. Overall pt is eating over 50% of her meals and getting stronger. She spoke with family and told them that she is "trying to get fatter"

## 2021-05-21 NOTE — Progress Notes (Signed)
PROGRESS NOTE    Mary Carson  [id number] DOB: [birth year] DOA: 05/14/2021 PCP: [name]   Brief Narrative: 75 year old with past medical history significant for hypertension, recently diagnosed with A. fib and UTI at [facility] who was at home and being seen by home health, when home health staff came by to her house they found her to be short of breath and hypoxic.  Patient was brought to the ER was found to have acute on chronic diastolic heart failure exacerbation with ongoing severe cachexia and unintentional weight loss.   Assessment & Plan:   Principal Problem:   Acute CHF (congestive heart failure) (HCC) Active Problems:   PAF (paroxysmal atrial fibrillation) (HCC)   Protein-calorie malnutrition, severe   Pleural effusion  1-Acute Hypoxic Respiratory Failure due to possible BL pleural effusion from hypoalbuminemia, new onset A. Fib, PNA.  -Patient received IV Lasix 7/26, and oral lasix 7/28. -Weight: 83---87--84--88 Evaluate for home oxygen needs.  She is still on 3-4 L oxygen, CO2 elevated,  chloride 90. Weight down. I Discussed case with cardiology , ECHO results are not impressive, at this time we cant explain her hypoxemia to be related to HF.  Pulmonologist consulted. Plan to treat for PNA< Resp failure also component of cachexia whic has led to dyspnea and malnutrition, BL effusio due to poor oncotic pressure.  Hold lasix.  Stable, she will need home oxygen.   2-New diagnosis of paroxysmal A. fib: Continue with Coreg and Eliquis Echo noted with preserved ejection fraction.  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  CT abdomen Pelvis negative for mas. Ct showed significant amount of stool.   5-Severe Constipation, fecal impaction:  Received Soap  Sud time 2, multiples enema. Continue with Miralax TID.  She will need laxative, schedule at discharge.  Per family patient has been taking miralax at home and senna. Will probably need to add Linzess at discharge.  She has had Multiple BM>  KUB look improved, now with moderate amount of stool.   Anxiety, depression: Continue with Celexa, ativan.   6-Hypokalemia: Replace.Hyperkalemia: resolved.   7-Right foot drop;  MRI negative for Stroke.  MRI Lumbar spine : Small right foraminal disc protrusions at L3-4 and L4-5, contacting and potentially affecting the exiting right L3 and L4 nerve roots respectively. Findings could contribute to right lower extremity symptoms. Mild to moderate right greater than left lateral recess narrowing at L4-5 related to disc bulge and facet disease. Moderate bilateral L4 and L5 foraminal stenosis related to disc bulge and facet hypertrophy. Discussed with Dr Marcello Moores from neurosurgery. MRI unlikely to explain foot drop, disc protrusion is very small. Patient will need follow up with neurologist for nerve conduction test.    Estimated body mass index is 14.75 kg/m as calculated from the following:   Height as of this encounter: '5\' 5"'$  (1.651 m).   Weight as of this encounter: 40.2 kg.   DVT prophylaxis: Eliquis Code Status: DNR Family Communication: Son updated 8/01 Disposition Plan:  Status is: Inpatient  Remains inpatient appropriate because:IV treatments appropriate due to intensity of illness or inability to take PO  Dispo: The patient is from: Home              Anticipated d/c is to: Home  Patient currently is not medically stable to d/c. Severely constipated plan for Bowel regimen , neurosurgery consulted for disc protrusion   Difficult to place patient No        Consultants:  None  Procedures:  Echo; preserved ejection fraction  Antimicrobials:    Subjective: She has had 2 BM today so far. Had 3 BM yesterday,..  She is  eating More.    Objective: Vitals:   05/20/21 1956 05/21/21 0506 05/21/21 0732 05/21/21 1109  BP: 133/83 110/67 127/70 122/70  Pulse: 84 68 73 78  Resp: 15 14    Temp: 98 F (36.7 C) 97.9 F (36.6 C) 97.8 F (36.6 C) 97.8 F (36.6 C)  TempSrc: Oral Oral Oral Oral  SpO2: 100% 94% 100% 100%  Weight:  40.2 kg    Height:        Intake/Output Summary (Last 24 hours) at 05/21/2021 1636 Last data filed at 05/21/2021 1500 Gross per 24 hour  Intake 880 ml  Output 700 ml  Net 180 ml    Filed Weights   05/19/21 0011 05/20/21 0449 05/21/21 0506  Weight: 38.3 kg 39 kg 40.2 kg    Examination:  General exam: NAD Respiratory system: CTA Cardiovascular system:  S 1, S 2 RRR Gastrointestinal system: BS present, NT Central nervous system: Alert,  Extremities: No edema    Data Reviewed: I have personally reviewed following labs and imaging studies  CBC: Recent Labs  Lab 05/15/21 0307 05/16/21 0818 05/17/21 0339 05/20/21 0306  WBC 6.7 9.3 11.3* 11.1*  HGB 11.1* 14.1 12.9 12.1  HCT 35.4* 44.7 40.3 38.6  MCV 104.4* 102.8* 102.3* 101.3*  PLT 219 274 252 0000000    Basic Metabolic Panel: Recent Labs  Lab 05/17/21 0339 05/18/21 0244 05/19/21 0736 05/20/21 0306 05/20/21 0808 05/21/21 1253  NA 133* 134* 133* 132*  --  135  K 4.5 5.2* 4.4 4.5  --  4.5  CL 88* 90* 90* 92*  --  91*  CO2 39* 40* 39* 33*  --  39*  GLUCOSE 103* 96 93 96  --  164*  BUN '17 20 12 12  '$ --  13  CREATININE <0.30* 0.42* 0.34* <0.30*  --  0.39*  CALCIUM 8.3* 8.6* 8.1* 7.7*  --  8.2*  MG  --   --  1.9  --   --   --   PHOS  --   --  2.4*  --  2.7  --     GFR: Estimated Creatinine Clearance: 38.6 mL/min (A) (by C-G formula based on SCr of 0.39 mg/dL (L)). Liver Function Tests: No results for input(s): AST, ALT, ALKPHOS, BILITOT, PROT, ALBUMIN in the last 168 hours.  No results for input(s): LIPASE, AMYLASE in the last 168 hours. No results for input(s): AMMONIA in the last 168 hours. Coagulation  Profile: No results for input(s): INR, PROTIME in the last 168 hours. Cardiac Enzymes: No results for input(s): CKTOTAL, CKMB, CKMBINDEX, TROPONINI in the last 168 hours. BNP (last 3 results) No results for input(s): PROBNP in the last 8760 hours. HbA1C: No results for input(s): HGBA1C in the last 72 hours. CBG: No results for input(s): GLUCAP in the last 168 hours. Lipid Profile: No results for input(s): CHOL, HDL, LDLCALC, TRIG, CHOLHDL, LDLDIRECT in the last 72 hours. Thyroid Function Tests: No results for input(s): TSH, T4TOTAL, FREET4, T3FREE, THYROIDAB in the last 72 hours.  Anemia Panel: No results for input(s): VITAMINB12, FOLATE, FERRITIN, TIBC, IRON, RETICCTPCT in the last 72  hours.  Sepsis Labs: No results for input(s): PROCALCITON, LATICACIDVEN in the last 168 hours.  Recent Results (from the past 240 hour(s))  Resp Panel by RT-PCR (Flu A&B, Covid) Nasopharyngeal Swab     Status: None   Collection Time: 05/14/21  6:24 PM   Specimen: Nasopharyngeal Swab; Nasopharyngeal(NP) swabs in vial transport medium  Result Value Ref Range Status   SARS Coronavirus 2 by RT PCR NEGATIVE NEGATIVE Final    Comment: (NOTE) SARS-CoV-2 target nucleic acids are NOT DETECTED.  The SARS-CoV-2 RNA is generally detectable in upper respiratory specimens during the acute phase of infection. The lowest concentration of SARS-CoV-2 viral copies this assay can detect is 138 copies/mL. A negative result does not preclude SARS-Cov-2 infection and should not be used as the sole basis for treatment or other patient management decisions. A negative result may occur with  improper specimen collection/handling, submission of specimen other than nasopharyngeal swab, presence of viral mutation(s) within the areas targeted by this assay, and inadequate number of viral copies(<138 copies/mL). A negative result must be combined with clinical observations, patient history, and epidemiological information.  The expected result is Negative.  Fact Sheet for Patients:  EntrepreneurPulse.com.au  Fact Sheet for Healthcare Providers:  IncredibleEmployment.be  This test is no t yet approved or cleared by the [place] FDA and  has been authorized for detection and/or diagnosis of SARS-CoV-2 by FDA under an Emergency Use Authorization (EUA). This EUA will remain  in effect (meaning this test can be used) for the duration of the COVID-19 declaration under Section 564(b)(1) of the Act, 21 U.S.C.section 360bbb-3(b)(1), unless the authorization is terminated  or revoked sooner.       Influenza A by PCR NEGATIVE NEGATIVE Final   Influenza B by PCR NEGATIVE NEGATIVE Final    Comment: (NOTE) The Xpert Xpress SARS-CoV-2/FLU/RSV plus assay is intended as an aid in the diagnosis of influenza from Nasopharyngeal swab specimens and should not be used as a sole basis for treatment. Nasal washings and aspirates are unacceptable for Xpert Xpress SARS-CoV-2/FLU/RSV testing.  Fact Sheet for Patients: EntrepreneurPulse.com.au  Fact Sheet for Healthcare Providers: IncredibleEmployment.be  This test is not yet approved or cleared by the [place] FDA and has been authorized for detection and/or diagnosis of SARS-CoV-2 by FDA under an Emergency Use Authorization (EUA). This EUA will remain in effect (meaning this test can be used) for the duration of the COVID-19 declaration under Section 564(b)(1) of the Act, 21 U.S.C. section 360bbb-3(b)(1), unless the authorization is terminated or revoked.  Performed at [facility], [place] [address] [place], [place] [address]           Radiology Studies: DG Abd 1 View  Result Date: 05/21/2021 CLINICAL DATA:  Constipation EXAM: ABDOMEN - 1 VIEW COMPARISON:  CT 05/19/2021 FINDINGS: Moderate stool burden throughout the colon appears somewhat improved since prior CT.  Nonobstructive bowel gas pattern. No organomegaly or free air. IMPRESSION: Moderate stool burden, improving since prior study. Electronically Signed   By: Rolm Baptise M.D.   On: 05/21/2021 08:39        Scheduled Meds:  apixaban  5 mg Oral BID   azithromycin  500 mg Oral Daily   citalopram  20 mg Oral Daily   feeding supplement  237 mL Oral BID BM   [START ON 05/22/2021] linaclotide  145 mcg Oral QAC breakfast   LORazepam  0.5 mg Oral QHS   megestrol  200 mg Oral BID   metoprolol tartrate  25 mg Oral BID   multivitamin with minerals  1 tablet Oral Daily   pantoprazole  40 mg Oral Daily   polyethylene glycol  17 g Oral TID   senna  1 tablet Oral BID   Continuous Infusions:  cefTRIAXone (ROCEPHIN)  IV 1 g (05/21/21 1150)     LOS: 6 days    Time spent: 35 minutes    Sharol Croghan A Labradford Schnitker, MD [facility]   If 7PM-7AM, please contact night-coverage [email or web address]  05/21/2021, 4:36 PM

## 2021-05-21 NOTE — Progress Notes (Signed)
Nutrition Follow-up  DOCUMENTATION CODES:   Underweight, Severe malnutrition in context of chronic illness  INTERVENTION:   -D/c Magic Cup -Continue Ensure Enlive po BID, each supplement provides 350 kcal and 20 grams of protein  -Continue MVI with minerals daily  NUTRITION DIAGNOSIS:   Severe Malnutrition related to chronic illness (CHF) as evidenced by severe fat depletion, severe muscle depletion, percent weight loss.  Ongoing  GOAL:   Patient will meet greater than or equal to 90% of their needs  Progressing  MONITOR:   PO intake, Supplement acceptance, Labs, Weight trends, Skin, I & O's  REASON FOR ASSESSMENT:   Consult Assessment of nutrition requirement/status  ASSESSMENT:   Mary Carson is a 75 y.o. female with history of hypertension recently diagnosed atrial fibrillation and UTI at [facility] who was at home and being seen by home health, when home health staff came by yesterday they found her to be short of breath and hypoxic, she was brought to the ER where she was found to have acute on chronic diastolic CHF along with ongoing severe cachexia and unintentional weight loss.  7/31- soap suds enema administered  Reviewed I/O's: +970 ml x 24 hours and +324 ml since admission  Pt using bedside commode at time of visit.   Per RN, pt having loose stools. Noted pt was given soap suds enema on 05/20/20 and is on a bowel regimen due to previous concerns of constipation.   Pt's intake has improved. Noted meal completions 125-100%. She is taking Ensure Enlive supplements. Will d/c Magic Cups secondary to improved oral intake.   Medications reviewed and include celexa, ativan, megace, miralax, and senna.   Labs reviewed: Na: 132.   Diet Order:   Diet Order             Diet Heart Room service appropriate? Yes; Fluid consistency: Thin; Fluid restriction: 1200 mL Fluid  Diet effective now                   EDUCATION NEEDS:   Education needs  have been addressed  Skin:  Skin Assessment: Reviewed RN Assessment  Last BM:  05/21/21  Height:   Ht Readings from Last 1 Encounters:  05/15/21 '5\' 5"'$  (1.651 m)    Weight:   Wt Readings from Last 1 Encounters:  05/21/21 40.2 kg    Ideal Body Weight:  56.8 kg  BMI:  Body mass index is 14.75 kg/m.  Estimated Nutritional Needs:   Kcal:  1500-1700  Protein:  75-90 grams  Fluid:  > 1.5 L    Mary Carson, RD, LDN, [place] Registered Dietitian II Certified Diabetes Care and Education Specialist Please refer to [facility] for RD and/or RD on-call/weekend/after hours pager

## 2021-05-21 NOTE — Progress Notes (Signed)
PROGRESS NOTE    Mary Carson  [id number] DOB: February 05, 1946 DOA: 05/14/2021 PCP: [name]   Brief Narrative: 74 year old with past medical history significant for hypertension, recently diagnosed with A. fib and UTI at [facility] who was at home and being seen by home health, when home health staff came by to her house they found her to be short of breath and hypoxic.  Patient was brought to the ER was found to have acute on chronic diastolic heart failure exacerbation with ongoing severe cachexia and unintentional weight loss.    Assessment & Plan:   Principal Problem:   Acute CHF (congestive heart failure) (HCC) Active Problems:   PAF (paroxysmal atrial fibrillation) (HCC)   Protein-calorie malnutrition, severe   Pleural effusion  1-Acute Hypoxic Respiratory Failure due to possible BL pleural effusion from hypoalbuminemia, new onset A. Fib, PNA.  -Patient received IV Lasix 7/26, and oral lasix 7/28. -Weight: 83---87--84 Evaluate for home oxygen needs.  She is still on 3-4 L oxygen, CO2 elevated,  chloride 90. Weight down. I Discussed case with cardiology , ECHO results are not impressive, at this time we cant explain her hypoxemia to be related to HF.  Pulmonologist consulted. Plan to treat for PNA< Resp failure also component of cachexia whic has led to dyspnea and malnutrition, BL effusio due to poor oncotic pressure.  Hold lasix.  Stable, she will nee home oxygen.   2-new diagnosis of paroxysmal A. fib: Continue with Coreg and Eliquis Echo noted with preserved ejection fraction.  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  CT abdomen Pelvis negative for mas. Ct showed significant amount of stool. Didn't had respond to enema. Nurse couldn't disimpact patient  manually. GI consulted.   5-Severe Constipation, fecal impaction:  Received Soap Sud time 2, had enema this am.  Continue with Miralax TID.  She will need laxative, schedule at discharge.  Per family patient has been taking miralax at home and senna. Will probably need to add Linzess at discharge.  She has had Multiple BM>  KUB look improved, now with moderate amount of stool.   Anxiety, depression: Continue with Celexa, ativan.   6-Hypokalemia: Replace.Hyperkalemia: resolved.   7-Right foot drop;  MRI negative for Stroke.  MRI Lumbar spine : Small right foraminal disc protrusions at L3-4 and L4-5, contacting and potentially affecting the exiting right L3 and L4 nerve roots respectively. Findings could contribute to right lower extremity symptoms. Mild to moderate right greater than left lateral recess narrowing at L4-5 related to disc bulge and facet disease. Moderate bilateral L4 and L5 foraminal stenosis related to disc bulge and facet hypertrophy. Discussed with Dr Marcello Moores from neurosurgery. MRI unlikely to explain foot drop, disc protrusion is very small. Patient will need follow up with neurologist for nerve conduction test.    Estimated body mass index is 14.75 kg/m as calculated from the following:   Height as of this encounter: '5\' 5"'$  (1.651 m).   Weight as of this encounter: 40.2 kg.   DVT prophylaxis: Eliquis Code Status: DNR Family Communication: Son updated 7/31 Disposition Plan:  Status is: Inpatient  Remains inpatient appropriate because:IV treatments appropriate due to intensity of illness or inability to take PO  Dispo: The patient is from: Home              Anticipated  d/c is to: Home              Patient currently is not medically stable to d/c. Severely constipated plan for Bowel regimen , neurosurgery consulted for disc protrusion   Difficult to place patient No        Consultants:  None  Procedures:  Echo; preserved ejection  fraction  Antimicrobials:    Subjective: She has had 2 BM today so far. Had 3 BM yesterday,..  She is eating More.    Objective: Vitals:   05/20/21 1956 05/21/21 0506 05/21/21 0732 05/21/21 1109  BP: 133/83 110/67 127/70 122/70  Pulse: 84 68 73 78  Resp: 15 14    Temp: 98 F (36.7 C) 97.9 F (36.6 C) 97.8 F (36.6 C) 97.8 F (36.6 C)  TempSrc: Oral Oral Oral Oral  SpO2: 100% 94% 100% 100%  Weight:  40.2 kg    Height:        Intake/Output Summary (Last 24 hours) at 05/21/2021 1316 Last data filed at 05/21/2021 1300 Gross per 24 hour  Intake 1010 ml  Output 400 ml  Net 610 ml    Filed Weights   05/19/21 0011 05/20/21 0449 05/21/21 0506  Weight: 38.3 kg 39 kg 40.2 kg    Examination:  General exam: NAD Respiratory system: CTA Cardiovascular system:  S 1, S 2 RRR Gastrointestinal system: BS present, Soft, nt Central nervous system: alert, follow command Extremities: No edema    Data Reviewed: I have personally reviewed following labs and imaging studies  CBC: Recent Labs  Lab 05/14/21 1324 05/15/21 0307 05/16/21 0818 05/17/21 0339 05/20/21 0306  WBC 10.8* 6.7 9.3 11.3* 11.1*  NEUTROABS 7.8*  --   --   --   --   HGB 15.1* 11.1* 14.1 12.9 12.1  HCT 48.2* 35.4* 44.7 40.3 38.6  MCV 101.7* 104.4* 102.8* 102.3* 101.3*  PLT 307 219 274 252 0000000    Basic Metabolic Panel: Recent Labs  Lab 05/14/21 1324 05/15/21 0307 05/16/21 0818 05/17/21 0339 05/18/21 0244 05/19/21 0736 05/20/21 0306 05/20/21 0808  NA 133*   < > 135 133* 134* 133* 132*  --   K 3.4*   < > 4.8 4.5 5.2* 4.4 4.5  --   CL 86*   < > 88* 88* 90* 90* 92*  --   CO2 36*   < > 39* 39* 40* 39* 33*  --   GLUCOSE 128*   < > 159* 103* 96 93 96  --   BUN 10   < > '12 17 20 12 12  '$ --   CREATININE 0.41*   < > 0.48 <0.30* 0.42* 0.34* <0.30*  --   CALCIUM 9.1   < > 8.6* 8.3* 8.6* 8.1* 7.7*  --   MG 1.9  --   --   --   --  1.9  --   --   PHOS  --   --   --   --   --  2.4*  --  2.7   < > = values in  this interval not displayed.    GFR: CrCl cannot be calculated (This lab value cannot be used to calculate CrCl because it is not a number: <0.30). Liver Function Tests: Recent Labs  Lab 05/14/21 1324  AST 19  ALT 18  ALKPHOS 28*  BILITOT 0.8  PROT 5.6*  ALBUMIN 3.4*    No results for input(s): LIPASE, AMYLASE in the last 168 hours. No results for input(s):  AMMONIA in the last 168 hours. Coagulation Profile: No results for input(s): INR, PROTIME in the last 168 hours. Cardiac Enzymes: No results for input(s): CKTOTAL, CKMB, CKMBINDEX, TROPONINI in the last 168 hours. BNP (last 3 results) No results for input(s): PROBNP in the last 8760 hours. HbA1C: No results for input(s): HGBA1C in the last 72 hours. CBG: No results for input(s): GLUCAP in the last 168 hours. Lipid Profile: No results for input(s): CHOL, HDL, LDLCALC, TRIG, CHOLHDL, LDLDIRECT in the last 72 hours. Thyroid Function Tests: No results for input(s): TSH, T4TOTAL, FREET4, T3FREE, THYROIDAB in the last 72 hours.  Anemia Panel: No results for input(s): VITAMINB12, FOLATE, FERRITIN, TIBC, IRON, RETICCTPCT in the last 72 hours.  Sepsis Labs: No results for input(s): PROCALCITON, LATICACIDVEN in the last 168 hours.  Recent Results (from the past 240 hour(s))  Resp Panel by RT-PCR (Flu A&B, Covid) Nasopharyngeal Swab     Status: None   Collection Time: 05/14/21  6:24 PM   Specimen: Nasopharyngeal Swab; Nasopharyngeal(NP) swabs in vial transport medium  Result Value Ref Range Status   SARS Coronavirus 2 by RT PCR NEGATIVE NEGATIVE Final    Comment: (NOTE) SARS-CoV-2 target nucleic acids are NOT DETECTED.  The SARS-CoV-2 RNA is generally detectable in upper respiratory specimens during the acute phase of infection. The lowest concentration of SARS-CoV-2 viral copies this assay can detect is 138 copies/mL. A negative result does not preclude SARS-Cov-2 infection and should not be used as the sole basis for  treatment or other patient management decisions. A negative result may occur with  improper specimen collection/handling, submission of specimen other than nasopharyngeal swab, presence of viral mutation(s) within the areas targeted by this assay, and inadequate number of viral copies(<138 copies/mL). A negative result must be combined with clinical observations, patient history, and epidemiological information. The expected result is Negative.  Fact Sheet for Patients:  EntrepreneurPulse.com.au  Fact Sheet for Healthcare Providers:  IncredibleEmployment.be  This test is no t yet approved or cleared by the [place] FDA and  has been authorized for detection and/or diagnosis of SARS-CoV-2 by FDA under an Emergency Use Authorization (EUA). This EUA will remain  in effect (meaning this test can be used) for the duration of the COVID-19 declaration under Section 564(b)(1) of the Act, 21 U.S.C.section 360bbb-3(b)(1), unless the authorization is terminated  or revoked sooner.       Influenza A by PCR NEGATIVE NEGATIVE Final   Influenza B by PCR NEGATIVE NEGATIVE Final    Comment: (NOTE) The Xpert Xpress SARS-CoV-2/FLU/RSV plus assay is intended as an aid in the diagnosis of influenza from Nasopharyngeal swab specimens and should not be used as a sole basis for treatment. Nasal washings and aspirates are unacceptable for Xpert Xpress SARS-CoV-2/FLU/RSV testing.  Fact Sheet for Patients: EntrepreneurPulse.com.au  Fact Sheet for Healthcare Providers: IncredibleEmployment.be  This test is not yet approved or cleared by the [place] FDA and has been authorized for detection and/or diagnosis of SARS-CoV-2 by FDA under an Emergency Use Authorization (EUA). This EUA will remain in effect (meaning this test can be used) for the duration of the COVID-19 declaration under Section 564(b)(1) of the Act, 21  U.S.C. section 360bbb-3(b)(1), unless the authorization is terminated or revoked.  Performed at [facility], [place] [address], [place], [place] [address]           Radiology Studies: DG Abd 1 View  Result Date: 05/21/2021 CLINICAL DATA:  Constipation EXAM: ABDOMEN - 1 VIEW COMPARISON:  CT 05/19/2021 FINDINGS: Moderate stool burden throughout the colon appears somewhat improved since prior CT. Nonobstructive bowel gas pattern. No organomegaly or free air. IMPRESSION: Moderate stool burden, improving since prior study. Electronically Signed   By: Rolm Baptise M.D.   On: 05/21/2021 08:39        Scheduled Meds:  apixaban  5 mg Oral BID   azithromycin  500 mg Oral Daily   citalopram  20 mg Oral Daily   feeding supplement  237 mL Oral BID BM   [START ON 05/22/2021] linaclotide  145 mcg Oral QAC breakfast   LORazepam  0.5 mg Oral QHS   megestrol  200 mg Oral BID   metoprolol tartrate  25 mg Oral BID   multivitamin with minerals  1 tablet Oral Daily   pantoprazole  40 mg Oral Daily   polyethylene glycol  17 g Oral TID   senna  1 tablet Oral BID   Continuous Infusions:  cefTRIAXone (ROCEPHIN)  IV 1 g (05/21/21 1150)     LOS: 6 days    Time spent: 35 minutes    Joelee Snoke A Shant Hence, MD [facility]   If 7PM-7AM, please contact night-coverage [email or web address]  05/21/2021, 1:16 PM

## 2021-05-21 NOTE — Discharge Instructions (Signed)

## 2021-05-21 NOTE — Therapy (Signed)
Occupational Therapy Treatment Patient Details Name: MARIZOL RAMIREZ MRN: [id number] DOB: 19-May-1946 Today's Date: 05/21/2021    History of present illness Pt is a 75yo female presenting to [facility] ED on 7/25 with chief complaint of hypoxia and weakness. Thought to be secondary to acute CHF. Xray revealed pleural effusion. CT angio negative for PE. PMH: HTN, recent diagnosis of AFIB,  anxiety, [place] spotted fever.   OT comments  Pt seen for OT ADL retraining session today however RN was in room and stated that pt was just assisted back to bed after being incontinent of bowels during transfer to 3:1. Therefore, pt was educated in and performed ther ex using theraband with focus on bilateral UE strengthening in preparation for increased participation with ADL's, transfers, strength and endurance. Pt benefits from rest breaks PRN and increased time for processing. She was overall Min A w/ verbal and tactile cues for proper follow through.    Follow Up Recommendations  Home health OT;Supervision/Assistance - 24 hour    Equipment Recommendations  None recommended by OT    Recommendations for Other Services      Precautions / Restrictions Precautions Precautions: Fall Restrictions Weight Bearing Restrictions: No              ADL either performed or assessed with clinical judgement   ADL Overall ADL's : Needs assistance/impaired   General ADL Comments: Pt limited by weakenss and decreased activity tolerance. RN was in room upon OT arrival and stated she was just assisted back to bed after using 3:1 and beng incontinent of bowel(s). Pt was therefore instructed in HEP for Bilateral UE theraband ex's.     Cognition Arousal/Alertness: Awake/alert Behavior During Therapy: WFL for tasks assessed/performed Overall Cognitive Status: No family/caregiver present to determine baseline cognitive functioning Area of Impairment: Problem solving     Problem Solving: Slow  processing General Comments: pt with decreased problem sovling, and requires increased time for processing [facility] as well)        Exercises Exercises: General Upper Extremity;Other exercises (Bilateral UE theraband ex's) General Exercises - Upper Extremity Shoulder Flexion: Strengthening;Both;10 reps;Supine;Theraband (Verbal and tc's required for positioning and proper follow through. Increased time for processing & performance) Shoulder ABduction: Both;10 reps;Supine;Theraband;Other (comment);Strengthening (Verbal and tc's required for positioning and proper follow through. Increased time for processing and performance) Elbow Flexion: Strengthening;Both;10 reps;Supine;Other (comment) (Verbal and tc's required for positioning and proper follow through. Increased time for processing. Increased time) Elbow Extension: Strengthening;Both;10 reps;Supine;Theraband;Other (comment) (Verbal and tc's required for positioning and proper follow through. Increased time for processing. Increased time for completetion)           Pertinent Vitals/ Pain       Pain Assessment: No/denies pain   Frequency  Min 2X/week        Progress Toward Goals  OT Goals(current goals can now be found in the care plan section)  Progress towards OT goals: Progressing toward goals  Acute Rehab OT Goals Patient Stated Goal: Get warm (Extra blankets given) OT Goal Formulation: With patient Time For Goal Achievement: 05/31/21 Potential to Achieve Goals: Good  Plan Discharge plan remains appropriate       AM-PAC OT "6 Clicks" Daily Activity     Outcome Measure   Help from another person eating meals?: A Little Help from another person taking care of personal grooming?: A Little Help from another person toileting, which includes using toliet, bedpan, or urinal?: A Little Help from another person bathing (including washing, rinsing, drying)?:  A Little Help from another person to put on and taking off regular upper  body clothing?: A Little Help from another person to put on and taking off regular lower body clothing?: A Little 6 Click Score: 18    End of Session Equipment Utilized During Treatment: Oxygen  OT Visit Diagnosis: Other abnormalities of gait and mobility (R26.89);Muscle weakness (generalized) (M62.81)   Activity Tolerance Patient limited by fatigue;Patient tolerated treatment well   Patient Left in bed;with call bell/phone within reach   Nurse Communication Other (comment) (Mobility not performed secondary to RN in room and just assisted pt back to bed after incontinent bowels.)        Time: [id number] OT Time Calculation (min): 29 min  Charges: OT General Charges $OT Visit: 1 Visit OT Treatments $Therapeutic Exercise: 23-37 mins   Jennfer Gassen Beth Dixon, OTR/L 05/21/2021, 10:16 AM

## 2021-05-22 DIAGNOSIS — I509 Heart failure, unspecified: Secondary | ICD-10-CM | POA: Diagnosis not present

## 2021-05-22 DIAGNOSIS — I5021 Acute systolic (congestive) heart failure: Secondary | ICD-10-CM | POA: Diagnosis not present

## 2021-05-22 DIAGNOSIS — I480 Paroxysmal atrial fibrillation: Secondary | ICD-10-CM | POA: Diagnosis not present

## 2021-05-22 MED ORDER — ADULT MULTIVITAMIN W/MINERALS CH: 1.0000 | ORAL_TABLET | Freq: Every day | ORAL | 0 refills | Status: AC

## 2021-05-22 MED ORDER — BISACODYL 10 MG RE SUPP: 10.0000 mg | RECTAL | 0 refills | Status: AC | PRN

## 2021-05-22 MED ORDER — CITALOPRAM HYDROBROMIDE 20 MG PO TABS: 40.0000 mg | ORAL_TABLET | Freq: Every day | ORAL | 0 refills | Status: AC

## 2021-05-22 MED ORDER — LINACLOTIDE 145 MCG PO CAPS: 145.0000 ug | ORAL_CAPSULE | Freq: Every day | ORAL | 3 refills | Status: AC

## 2021-05-22 MED ORDER — SENNA 8.6 MG PO TABS: 1.0000 | ORAL_TABLET | Freq: Two times a day (BID) | ORAL | 0 refills | Status: AC

## 2021-05-22 MED ORDER — ENSURE ENLIVE PO LIQD: 237.0000 mL | Freq: Two times a day (BID) | ORAL | 12 refills | Status: AC

## 2021-05-22 MED ORDER — POLYETHYLENE GLYCOL 3350 17 G PO PACK: 17.0000 g | PACK | Freq: Every day | ORAL | 0 refills | Status: AC

## 2021-05-22 MED ORDER — AZITHROMYCIN 250 MG PO TABS: 250.0000 mg | ORAL_TABLET | Freq: Every day | ORAL | 0 refills | Status: AC

## 2021-05-22 MED ORDER — POLYETHYLENE GLYCOL 3350 17 G PO PACK: 17.0000 g | PACK | Freq: Every day | ORAL | Status: DC

## 2021-05-22 MED ORDER — CEFDINIR 300 MG PO CAPS: 300.0000 mg | ORAL_CAPSULE | Freq: Two times a day (BID) | ORAL | 0 refills | Status: AC

## 2021-05-22 NOTE — Discharge Summary (Addendum)
Physician Discharge Summary  DAVIN GRIFFIN [id number] DOB: 1945-10-31 DOA: 05/14/2021  PCP: Georganna Skeans, PA-C  Admit date: 05/14/2021 Discharge date: 05/22/2021  Admitted From: Home  Disposition:  Home   Recommendations for Outpatient Follow-up:  Follow up with PCP in 1-2 weeks Please obtain BMP/CBC in one week Follow up with PCP for further evaluation of FTT, weight loss.  Monitor volume status.  Make sure patient has daily BM.   Home Health: Yes.   Discharge Condition:Stable.  CODE STATUS: DNR Diet recommendation: Regular diet.   Brief/Interim Summary: 75 year old with past medical history significant for hypertension, recently diagnosed with A. fib and UTI at [facility] who was at home and being seen by home health, when home health staff came by to her house they found her to be short of breath and hypoxic.  Patient was brought to the ER was found to have acute on chronic diastolic heart failure exacerbation with ongoing severe cachexia and unintentional weight loss.    1-Acute Hypoxic Respiratory Failure due to possible BL pleural effusion from hypoalbuminemia, new onset A. Fib, PNA. -Patient received IV Lasix 7/26, and oral lasix 7/28. -Weight: 83---87--84--88 Evaluate for home oxygen needs. She is still on 3-4 L oxygen, CO2 elevated,  chloride 90. Weight down. I Discussed case with cardiology , ECHO results are not impressive, at this time we cant explain her hypoxemia to be related to HF. Pulmonologist consulted. Plan to treat for PNA< Resp failure also component of cachexia whic has led to dyspnea and malnutrition, BL effusio due to poor oncotic pressure. Hold lasix. Discharge on Cefdinir and azithromycin for 3 days.  Stable, she will need home oxygen.  HF was rule out   2-New diagnosis of paroxysmal A. fib: Continue with Coreg and Eliquis Echo noted with preserved ejection fraction.  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 CT abdomen Pelvis negative for mas. Ct showed significant amount of stool.   5-Severe Constipation, fecal impaction: Received Soap Sud time 2, multiples enema. Treated with Miralax TID. Discharge on miralax daily.  She will need laxative, schedule at discharge. Per family patient has been taking miralax at home and senna. Will probably need to add Linzess at discharge. She has had Multiple BM>  KUB look improved, now with moderate amount of stool. Discharge on [place].  She has had 3 BM today.   Anxiety, depression: Continue with Celexa, ativan.   6-Hypokalemia: Replace.Hyperkalemia: resolved.   7-Right foot drop; MRI negative for Stroke. MRI Lumbar spine : Small right foraminal disc protrusions at L3-4 and L4-5, contacting and potentially affecting the exiting right L3 and L4 nerve roots respectively. Findings could contribute to right lower extremity symptoms. Mild to moderate right greater than left lateral recess narrowing at L4-5 related to disc bulge and facet disease. Moderate bilateral L4 and L5 foraminal stenosis related to disc bulge and facet hypertrophy. Discussed with Dr Marcello Moores from neurosurgery. MRI unlikely to explain foot drop, disc protrusion is very small. Patient will need follow up with neurologist for nerve conduction test.      Estimated body mass index is 14.75 kg/m as calculated from the following:   Height as of this encounter: '5\' 5"'$  (1.651 m).   Weight as of this encounter: 40.2 kg.    Discharge Diagnoses:  Principal Problem: Acute Hypoxic Respiratory Failure due  to possible BL pleural effusion from hypoalbuminemia, new onset A. Fib, PNA. Active Problems:   PAF (paroxysmal atrial fibrillation) (HCC)   Protein-calorie malnutrition, severe   Pleural effusion    Discharge  Instructions  Discharge Instructions     Diet - low sodium heart healthy   Complete by: As directed    Increase activity slowly   Complete by: As directed       Allergies as of 05/22/2021       Reactions   Augmentin [amoxicillin-pot Clavulanate]    Penicillins         Medication List     STOP taking these medications    aspirin 81 MG EC tablet   dicyclomine 10 MG capsule Commonly known as: BENTYL   levofloxacin 500 MG tablet Commonly known as: LEVAQUIN   lisinopril 5 MG tablet Commonly known as: ZESTRIL   mirtazapine 15 MG tablet Commonly known as: REMERON   OVER THE COUNTER MEDICATION   sennosides-docusate sodium 8.6-50 MG tablet Commonly known as: SENOKOT-S       TAKE these medications    acetaminophen 325 MG tablet Commonly known as: TYLENOL Take by mouth.   alendronate 70 MG tablet Commonly known as: FOSAMAX Take 70 mg by mouth once a week.   azithromycin 250 MG tablet Commonly known as: ZITHROMAX Take 1 tablet (250 mg total) by mouth daily.   benzonatate 100 MG capsule Commonly known as: TESSALON Take 1 capsule by mouth three times daily as needed for cough   bisacodyl 10 MG suppository Commonly known as: Dulcolax Place 1 suppository (10 mg total) rectally as needed for moderate constipation.   calcium carbonate 500 MG chewable tablet Commonly known as: TUMS - dosed in mg elemental calcium Chew 1 tablet by mouth daily.   cefdinir 300 MG capsule Commonly known as: OMNICEF Take 1 capsule (300 mg total) by mouth 2 (two) times daily for 3 days.   Cholecalciferol 25 MCG (1000 UT) tablet Take 1,000 Units by mouth daily.   citalopram 20 MG tablet Commonly known as: CELEXA Take 2 tablets (40 mg total) by mouth at bedtime.   Eliquis 5 MG Tabs tablet Generic drug: apixaban Take 5 mg by mouth 2 (two) times daily.   feeding supplement Liqd Take 237 mLs by mouth 2 (two) times daily between meals.   Fish Oil 1000 MG Caps Take 1,000 mg  by mouth daily.   linaclotide 145 MCG Caps capsule Commonly known as: LINZESS Take 1 capsule (145 mcg total) by mouth daily before breakfast. Start taking on: May 23, 2021   LORazepam 1 MG tablet Commonly known as: ATIVAN Take 1 tablet (1 mg total) by mouth at bedtime.   megestrol 20 MG tablet Commonly known as: MEGACE Take 1 tablet (20 mg total) by mouth daily.   metoprolol tartrate 25 MG tablet Commonly known as: LOPRESSOR Take 25 mg by mouth 2 (two) times daily.   multivitamin with minerals Tabs tablet Take 1 tablet by mouth daily.   pantoprazole 40 MG tablet Commonly known as: PROTONIX Take 40 mg by mouth daily.   polyethylene glycol 17 g packet Commonly known as: MIRALAX / GLYCOLAX Take 17 g by mouth daily.   senna 8.6 MG Tabs tablet Commonly known as: SENOKOT Take 1 tablet (8.6 mg total) by mouth 2 (two) times daily.               Durable Medical Equipment  (From admission, onward)  Start     Ordered   05/18/21 1632  For home use only DME oxygen  Once       Question Answer Comment  Length of Need 6 Months   Mode or (Route) Nasal cannula   Liters per Minute 2   Frequency Continuous (stationary and portable oxygen unit needed)   Oxygen delivery system Gas      05/18/21 [facility] [place] Follow up.   Specialty: Home Health Services Why: HHRN,HHPT Contact information: [address] [place] [address] [phone]         [facility] Follow up.   Why: home oxygen Contact information: [address] [place] [address] [phone]         Georganna Skeans, PA-C. Go on 06/07/2021.   Specialty: Family Medicine Why: '@1'$ :20pm Contact information: [place] [address] [phone]                Allergies  Allergen Reactions   Augmentin [Amoxicillin-Pot Clavulanate]    Penicillins     Consultations: Pulmonary     Procedures/Studies: DG Chest 2 View  Result Date: 05/14/2021 CLINICAL DATA:  Shortness of breath, hypoxia. EXAM: CHEST - 2 VIEW COMPARISON:  May 09, 2021. FINDINGS: The heart size and mediastinal contours are within normal limits. No pneumothorax is noted. Minimal bibasilar subsegmental atelectasis is noted with small pleural effusions. The visualized skeletal structures are unremarkable. IMPRESSION: Minimal bibasilar subsegmental atelectasis with small pleural effusions. Electronically Signed   By: Marijo Conception M.D.   On: 05/14/2021 15:28   DG Abd 1 View  Result Date: 05/21/2021 CLINICAL DATA:  Constipation EXAM: ABDOMEN - 1 VIEW COMPARISON:  CT 05/19/2021 FINDINGS: Moderate stool burden throughout the colon appears somewhat improved since prior CT. Nonobstructive bowel gas pattern. No organomegaly or free air. IMPRESSION: Moderate stool burden, improving since prior study. Electronically Signed   By: Rolm Baptise M.D.   On: 05/21/2021 08:39   CT Angio Chest Pulmonary Embolism (PE) W or WO Contrast  Result Date: 05/15/2021 CLINICAL DATA:  Chest pain and shortness of breath. EXAM: CT ANGIOGRAPHY CHEST WITH CONTRAST TECHNIQUE: Multidetector CT imaging of the chest was performed using the standard protocol during bolus administration of intravenous contrast. Multiplanar CT image reconstructions and MIPs were obtained to evaluate the vascular anatomy. CONTRAST:  39m OMNIPAQUE IOHEXOL 350 MG/ML SOLN COMPARISON:  CT scan 02/04/2021 FINDINGS: Cardiovascular: The heart is normal in size. No pericardial effusion. Stable tortuosity, ectasia and scattered calcification involving the thoracic aorta. The pulmonary arterial tree is well opacified. No filling defects to suggest pulmonary embolism. Mediastinum/Nodes: Stable scattered mediastinal and hilar lymph nodes but no mass or overt adenopathy. The esophagus is grossly normal. Lungs/Pleura: Small bilateral pleural effusions are noted with overlying  atelectasis. Patchy nodular ground-glass airspace opacities in both lower lobes could suggest early infiltrates or inflammation. No focal airspace consolidation. No worrisome pulmonary lesions. Upper Abdomen: No significant upper abdominal findings. Musculoskeletal: Marked cachexia. The bony thorax is intact. Review of the MIP images confirms the above findings. IMPRESSION: 1. No CT findings for pulmonary embolism. 2. Stable tortuosity, ectasia and scattered calcification of the thoracic aorta. 3. Small bilateral pleural effusions with overlying atelectasis. 4. Patchy nodular ground-glass airspace opacities in both lower lobes could suggest early infiltrates or inflammation. 5. Marked cachexia. 6. Aortic atherosclerosis. Aortic Atherosclerosis (ICD10-I70.0). Electronically Signed  By: Marijo Sanes M.D.   On: 05/15/2021 05:04   MR BRAIN WO CONTRAST  Result Date: 05/17/2021 CLINICAL DATA:  Neuro deficit, acute, stroke suspected. EXAM: MRI HEAD WITHOUT CONTRAST TECHNIQUE: Multiplanar, multiecho pulse sequences of the brain and surrounding structures were obtained without intravenous contrast. COMPARISON:  Prior head CT examinations 05/16/2020 and earlier. FINDINGS: Brain: Mild generalized cerebral and cerebellar atrophy. Mild-to-moderate multifocal T2/FLAIR hyperintensity within the cerebral white matter, nonspecific but compatible with chronic small vessel ischemic disease. Mild chronic small vessel ischemic changes are also present within the pons. There is no acute infarct. No evidence of an intracranial mass. No chronic intracranial blood products. No extra-axial fluid collection. No midline shift. Vascular: Expected proximal arterial flow voids. Skull and upper cervical spine: No focal suspicious marrow lesion. Sinuses/Orbits: Visualized orbits show no acute finding. Minimal bilateral ethmoid and maxillary sinus mucosal thickening. Other: Trace fluid within the bilateral mastoid air cells. IMPRESSION: No  evidence of acute intracranial abnormality. Chronic small-vessel ischemic changes which are mild-to-moderate in the cerebral white matter, and mild in the pons. Mild generalized parenchymal atrophy. Minimal paranasal sinus disease at the imaged levels, as described. Trace fluid within the bilateral mastoid air cells. Electronically Signed   By: Kellie Simmering DO   On: 05/17/2021 14:07   MR LUMBAR SPINE WO CONTRAST  Result Date: 05/19/2021 CLINICAL DATA:  Initial evaluation for myelopathy, right foot drop. EXAM: MRI LUMBAR SPINE WITHOUT CONTRAST TECHNIQUE: Multiplanar, multisequence MR imaging of the lumbar spine was performed. No intravenous contrast was administered. COMPARISON:  None available. FINDINGS: Segmentation: Standard. Lowest well-formed disc space labeled the L5-S1 level. Alignment: Physiologic with preservation of the normal lumbar lordosis. No listhesis. Vertebrae: Vertebral body height maintained without acute or chronic fracture. Bone marrow signal intensity diffusely heterogeneous without worrisome osseous lesion. No abnormal marrow edema. Conus medullaris and cauda equina: Conus extends to the L1 level. Conus and cauda equina appear normal. Paraspinal and other soft tissues: Diffuse cachexia noted within the external soft tissues. Associated chronic atrophy noted about the posterior paraspinous musculature. Paraspinous soft tissues demonstrate no acute finding. Few tiny simple cyst noted about the visualized kidneys. Partially visualized visceral structures otherwise grossly unremarkable. Disc levels: T11-12: Disc desiccation without significant disc bulge. No stenosis. T12-L1: Disc desiccation without significant disc bulge. No stenosis. L1-2: Disc desiccation without significant disc bulge. No stenosis. L2-3: Disc desiccation with mild circumferential disc bulge. Mild facet hypertrophy. No significant spinal stenosis. Foramina remain patent. L3-4: Disc desiccation with mild disc bulge.  Superimposed small right foraminal disc protrusion contacts the exiting right L3 nerve root as it courses of the right neural foramen (series 7, image 23). Associated annular fissure. Mild bilateral facet hypertrophy. Resultant mild narrowing of the lateral recesses. Central canal remains patent. Mild bilateral L3 foraminal stenosis. L4-5: Degenerative intervertebral disc space narrowing with disc desiccation and mild disc bulge. Associated mild reactive endplate spurring. Superimposed small right foraminal to extraforaminal disc protrusion contacts the exiting right L4 nerve root as it courses out of the right neural foramen (series 4, image 4). Moderate right worse than left facet hypertrophy. Resultant mild to moderate narrowing of the lateral recesses, worse on the right. Moderate bilateral L4 foraminal stenosis. L5-S1: Degenerative intervertebral disc space narrowing with disc desiccation and mild disc bulge. Associated reactive endplate spurring. Bulging disc mildly indents the ventral thecal sac without frank neural impingement. Moderate facet hypertrophy. Lateral recesses remain patent. Moderate bilateral L5 foraminal stenosis. IMPRESSION: 1. Small right foraminal disc protrusions at L3-4 and  L4-5, contacting and potentially affecting the exiting right L3 and L4 nerve roots respectively. Findings could contribute to right lower extremity symptoms. 2. Mild to moderate right greater than left lateral recess narrowing at L4-5 related to disc bulge and facet disease. 3. Moderate bilateral L4 and L5 foraminal stenosis related to disc bulge and facet hypertrophy. Electronically Signed   By: Jeannine Boga M.D.   On: 05/19/2021 00:35   CT ABDOMEN PELVIS W CONTRAST  Result Date: 05/19/2021 CLINICAL DATA:  Loss of weight. EXAM: CT ABDOMEN AND PELVIS WITH CONTRAST TECHNIQUE: Multidetector CT imaging of the abdomen and pelvis was performed using the standard protocol following bolus administration of  intravenous contrast. CONTRAST:  39m OMNIPAQUE IOHEXOL 300 MG/ML  SOLN COMPARISON:  07/07/2019 FINDINGS: Lower chest: Small bilateral pleural effusion scratch set small bilateral pleural effusions identified. Mild pneumonitis within the right lower lobe with associated bronchial wall thickening identified. Hepatobiliary: Focal area of low attenuation adjacent to the falciform ligament is similar to previous exam and is most consistent with focal fatty deposition. Gallbladder appears collapsed. No bile duct dilatation. Pancreas: Unremarkable. No pancreatic ductal dilatation or surrounding inflammatory changes. Spleen: Normal in size without focal abnormality. Adrenals/Urinary Tract: Normal appearance of the adrenal glands. No suspicious mass, nephrolithiasis or hydronephrosis identified. Stomach/Bowel: Stomach appears nondistended. Mild increase caliber of the terminal ileum measuring up to 2.4 cm. No bowel wall thickening or inflammation. The appendix is not confidently identified separate from the right lower quadrant bowel loops. There is a marked stool burden identified throughout the colon up to the level of the rectum. Large volume of desiccated stool within the rectum measures 8.3 x 9.7 by 13.8 cm (volume = 580 cm^3). Vascular/Lymphatic: Aortic atherosclerosis. No aneurysm. No abdominopelvic adenopathy identified. Reproductive: Status post hysterectomy. No adnexal masses. Other: No significant free fluid or fluid collections identified. Patient appears diffusely cachectic. 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 IMPRESSION: 1. No mass or adenopathy identified within the abdomen or pelvis. 2. Marked stool burden identified throughout the colon up to the level of the rectum. Large volume of desiccated stool within the rectum measures approximately 580 cc. Cannot exclude rectal impaction. Correlate for any clinical signs or symptoms of constipation. 3. Small bilateral pleural effusions.  Mild pneumonitis within the right lower lobe may represent infection or sequelae of aspiration 4. Aortic atherosclerosis. Aortic Atherosclerosis (ICD10-I70.0). Electronically Signed   By: TKerby MoorsM.D.   On: 05/19/2021 11:33   DG CHEST PORT 1 VIEW  Result Date: 05/16/2021 CLINICAL DATA:  Shortness of breath.  CHF EXAM: PORTABLE CHEST 1 VIEW COMPARISON:  Radiograph 05/14/2021, chest CT 05/15/2021 FINDINGS: Unchanged cardiomediastinal silhouette. There is no new focal airspace disease. There is no large pleural effusion or visible pneumothorax. There is no acute osseous abnormality. IMPRESSION: No focal airspace disease. Electronically Signed   By: JMaurine Simmering  On: 05/16/2021 14:35   ECHOCARDIOGRAM COMPLETE  Result Date: 05/15/2021    ECHOCARDIOGRAM REPORT   Patient Name:   Mary MULANAXDate of Exam: 05/15/2021 Medical Rec #:  [id number]           Height:       65.0 in Accession #:    [id number]          Weight:       81.0 lb Date of Birth:  1December 09, 1947          BSA:          1.347 m Patient Age:  74 years             BP:           124/67 mmHg Patient Gender: F                    HR:           69 bpm. Exam Location:  Inpatient Procedure: 2D Echo, Cardiac Doppler and Color Doppler Indications:    Congestive Heart Failure I50.9  History:        Patient has no prior history of Echocardiogram examinations.                 CHF.  Sonographer:    Bernadene Person RDCS Referring Phys: [place]  1. Left ventricular ejection fraction, by estimation, is 60 to 65%. The left ventricle has normal function. The left ventricle has no regional wall motion abnormalities. Left ventricular diastolic parameters are consistent with Grade I diastolic dysfunction (impaired relaxation).  2. Right ventricular systolic function is normal. The right ventricular size is normal.  3. The mitral valve is normal in structure. Trivial mitral valve regurgitation. No evidence of mitral stenosis.  4.  The aortic valve is tricuspid. There is mild calcification of the aortic valve. Aortic valve regurgitation is not visualized. No aortic stenosis is present. FINDINGS  Left Ventricle: Left ventricular ejection fraction, by estimation, is 60 to 65%. The left ventricle has normal function. The left ventricle has no regional wall motion abnormalities. The left ventricular internal cavity size was normal in size. There is  no left ventricular hypertrophy. Left ventricular diastolic parameters are consistent with Grade I diastolic dysfunction (impaired relaxation). Normal left ventricular filling pressure. Right Ventricle: The right ventricular size is normal. No increase in right ventricular wall thickness. Right ventricular systolic function is normal. Left Atrium: Left atrial size was normal in size. Right Atrium: Right atrial size was normal in size. Pericardium: There is no evidence of pericardial effusion. Mitral Valve: The mitral valve is normal in structure. Trivial mitral valve regurgitation. No evidence of mitral valve stenosis. Tricuspid Valve: The tricuspid valve is normal in structure. Tricuspid valve regurgitation is trivial. No evidence of tricuspid stenosis. Aortic Valve: The aortic valve is tricuspid. There is mild calcification of the aortic valve. Aortic valve regurgitation is not visualized. No aortic stenosis is present. Pulmonic Valve: The pulmonic valve was normal in structure. Pulmonic valve regurgitation is not visualized. No evidence of pulmonic stenosis. Aorta: The aortic root is normal in size and structure. Venous: The inferior vena cava was not well visualized. IAS/Shunts: No atrial level shunt detected by color flow Doppler.  LEFT VENTRICLE PLAX 2D LVIDd:         4.20 cm  Diastology LVIDs:         2.70 cm  LV e' medial:    6.60 cm/s LV PW:         0.80 cm  LV E/e' medial:  8.4 LV IVS:        0.90 cm  LV e' lateral:   7.81 cm/s LVOT diam:     2.00 cm  LV E/e' lateral: 7.1 LV SV:         39 LV  SV Index:   29 LVOT Area:     3.14 cm  RIGHT VENTRICLE RV S prime:     13.40 cm/s TAPSE (M-mode): 1.9 cm LEFT ATRIUM           Index  RIGHT ATRIUM           Index LA diam:      2.90 cm 2.15 cm/m  RA Area:     10.70 cm LA Vol (A2C): 25.4 ml 18.85 ml/m RA Volume:   23.90 ml  17.74 ml/m LA Vol (A4C): 20.7 ml 15.37 ml/m  AORTIC VALVE LVOT Vmax:   66.10 cm/s LVOT Vmean:  42.500 cm/s LVOT VTI:    0.124 m  AORTA Ao Root diam: 3.10 cm Ao Asc diam:  3.00 cm MITRAL VALVE               TRICUSPID VALVE MV Area (PHT): 2.99 cm    TR Peak grad:   14.9 mmHg MV Decel Time: 254 msec    TR Vmax:        193.00 cm/s MV E velocity: 55.70 cm/s MV A velocity: 49.60 cm/s  SHUNTS MV E/A ratio:  1.12        Systemic VTI:  0.12 m                            Systemic Diam: 2.00 cm Skeet Latch MD Electronically signed by Skeet Latch MD Signature Date/Time: 05/15/2021/11:43:53 AM    Final     Subjective: She is ready to go home.  She does not wants to go to rehab.  She is eating more. She has had 3 BM today   Discharge Exam: Vitals:   05/22/21 0510 05/22/21 0843  BP: 137/71 112/74  Pulse: 71 84  Resp: 20   Temp: 98.2 F (36.8 C) 98.2 F (36.8 C)  SpO2: 97% 100%     General: Pt is alert, awake, not in acute distress Cardiovascular: RRR, S1/S2 +, no rubs, no gallops Respiratory: CTA bilaterally, no wheezing, no rhonchi Abdominal: Soft, NT, ND, bowel sounds + Extremities: no edema, no cyanosis    The results of significant diagnostics from this hospitalization (including imaging, microbiology, ancillary and laboratory) are listed below for reference.     Microbiology: Recent Results (from the past 240 hour(s))  Resp Panel by RT-PCR (Flu A&B, Covid) Nasopharyngeal Swab     Status: None   Collection Time: 05/14/21  6:24 PM   Specimen: Nasopharyngeal Swab; Nasopharyngeal(NP) swabs in vial transport medium  Result Value Ref Range Status   SARS Coronavirus 2 by RT PCR NEGATIVE NEGATIVE Final     Comment: (NOTE) SARS-CoV-2 target nucleic acids are NOT DETECTED.  The SARS-CoV-2 RNA is generally detectable in upper respiratory specimens during the acute phase of infection. The lowest concentration of SARS-CoV-2 viral copies this assay can detect is 138 copies/mL. A negative result does not preclude SARS-Cov-2 infection and should not be used as the sole basis for treatment or other patient management decisions. A negative result may occur with  improper specimen collection/handling, submission of specimen other than nasopharyngeal swab, presence of viral mutation(s) within the areas targeted by this assay, and inadequate number of viral copies(<138 copies/mL). A negative result must be combined with clinical observations, patient history, and epidemiological information. The expected result is Negative.  Fact Sheet for Patients:  EntrepreneurPulse.com.au  Fact Sheet for Healthcare Providers:  IncredibleEmployment.be  This test is no t yet approved or cleared by the [place] FDA and  has been authorized for detection and/or diagnosis of SARS-CoV-2 by FDA under an Emergency Use Authorization (EUA). This EUA will remain  in effect (meaning this test can be used) for the duration  of the COVID-19 declaration under Section 564(b)(1) of the Act, 21 U.S.C.section 360bbb-3(b)(1), unless the authorization is terminated  or revoked sooner.       Influenza A by PCR NEGATIVE NEGATIVE Final   Influenza B by PCR NEGATIVE NEGATIVE Final    Comment: (NOTE) The Xpert Xpress SARS-CoV-2/FLU/RSV plus assay is intended as an aid in the diagnosis of influenza from Nasopharyngeal swab specimens and should not be used as a sole basis for treatment. Nasal washings and aspirates are unacceptable for Xpert Xpress SARS-CoV-2/FLU/RSV testing.  Fact Sheet for Patients: EntrepreneurPulse.com.au  Fact Sheet for Healthcare  Providers: IncredibleEmployment.be  This test is not yet approved or cleared by the [place] FDA and has been authorized for detection and/or diagnosis of SARS-CoV-2 by FDA under an Emergency Use Authorization (EUA). This EUA will remain in effect (meaning this test can be used) for the duration of the COVID-19 declaration under Section 564(b)(1) of the Act, 21 U.S.C. section 360bbb-3(b)(1), unless the authorization is terminated or revoked.  Performed at [facility], [place] [address] [place], [place] [address]      Labs: BNP (last 3 results) Recent Labs    05/14/21 1813  BNP [id number]*   Basic Metabolic Panel: Recent Labs  Lab 05/17/21 0339 05/18/21 0244 05/19/21 0736 05/20/21 0306 05/20/21 0808 05/21/21 1253  NA 133* 134* 133* 132*  --  135  K 4.5 5.2* 4.4 4.5  --  4.5  CL 88* 90* 90* 92*  --  91*  CO2 39* 40* 39* 33*  --  39*  GLUCOSE 103* 96 93 96  --  164*  BUN '17 20 12 12  '$ --  13  CREATININE <0.30* 0.42* 0.34* <0.30*  --  0.39*  CALCIUM 8.3* 8.6* 8.1* 7.7*  --  8.2*  MG  --   --  1.9  --   --   --   PHOS  --   --  2.4*  --  2.7  --    Liver Function Tests: No results for input(s): AST, ALT, ALKPHOS, BILITOT, PROT, ALBUMIN in the last 168 hours. No results for input(s): LIPASE, AMYLASE in the last 168 hours. No results for input(s): AMMONIA in the last 168 hours. CBC: Recent Labs  Lab 05/16/21 0818 05/17/21 0339 05/20/21 0306  WBC 9.3 11.3* 11.1*  HGB 14.1 12.9 12.1  HCT 44.7 40.3 38.6  MCV 102.8* 102.3* 101.3*  PLT 274 252 258   Cardiac Enzymes: No results for input(s): CKTOTAL, CKMB, CKMBINDEX, TROPONINI in the last 168 hours. BNP: Invalid input(s): POCBNP CBG: No results for input(s): GLUCAP in the last 168 hours. D-Dimer No results for input(s): DDIMER in the last 72 hours. Hgb A1c No results for input(s): HGBA1C in the last 72 hours. Lipid Profile No results for input(s): CHOL, HDL, LDLCALC, TRIG, CHOLHDL,  LDLDIRECT in the last 72 hours. Thyroid function studies No results for input(s): TSH, T4TOTAL, T3FREE, THYROIDAB in the last 72 hours.  Invalid input(s): FREET3 Anemia work up No results for input(s): VITAMINB12, FOLATE, FERRITIN, TIBC, IRON, RETICCTPCT in the last 72 hours. Urinalysis    Component Value Date/Time   COLORURINE YELLOW 05/15/2021 0507   APPEARANCEUR CLEAR 05/15/2021 0507   LABSPEC 1.010 05/15/2021 0507   PHURINE 6.0 05/15/2021 0507   GLUCOSEU NEGATIVE 05/15/2021 0507   HGBUR NEGATIVE 05/15/2021 0507   BILIRUBINUR NEGATIVE 05/15/2021 0507   BILIRUBINUR negative 10/12/2020 1142   BILIRUBINUR neg 05/02/2020 [place] 05/15/2021 0507   PROTEINUR  NEGATIVE 05/15/2021 0507   UROBILINOGEN 0.2 10/12/2020 1142   NITRITE NEGATIVE 05/15/2021 0507   LEUKOCYTESUR TRACE (A) 05/15/2021 0507   Sepsis Labs Invalid input(s): PROCALCITONIN,  WBC,  LACTICIDVEN Microbiology Recent Results (from the past 240 hour(s))  Resp Panel by RT-PCR (Flu A&B, Covid) Nasopharyngeal Swab     Status: None   Collection Time: 05/14/21  6:24 PM   Specimen: Nasopharyngeal Swab; Nasopharyngeal(NP) swabs in vial transport medium  Result Value Ref Range Status   SARS Coronavirus 2 by RT PCR NEGATIVE NEGATIVE Final    Comment: (NOTE) SARS-CoV-2 target nucleic acids are NOT DETECTED.  The SARS-CoV-2 RNA is generally detectable in upper respiratory specimens during the acute phase of infection. The lowest concentration of SARS-CoV-2 viral copies this assay can detect is 138 copies/mL. A negative result does not preclude SARS-Cov-2 infection and should not be used as the sole basis for treatment or other patient management decisions. A negative result may occur with  improper specimen collection/handling, submission of specimen other than nasopharyngeal swab, presence of viral mutation(s) within the areas targeted by this assay, and inadequate number of viral copies(<138 copies/mL). A  negative result must be combined with clinical observations, patient history, and epidemiological information. The expected result is Negative.  Fact Sheet for Patients:  EntrepreneurPulse.com.au  Fact Sheet for Healthcare Providers:  IncredibleEmployment.be  This test is no t yet approved or cleared by the [place] FDA and  has been authorized for detection and/or diagnosis of SARS-CoV-2 by FDA under an Emergency Use Authorization (EUA). This EUA will remain  in effect (meaning this test can be used) for the duration of the COVID-19 declaration under Section 564(b)(1) of the Act, 21 U.S.C.section 360bbb-3(b)(1), unless the authorization is terminated  or revoked sooner.       Influenza A by PCR NEGATIVE NEGATIVE Final   Influenza B by PCR NEGATIVE NEGATIVE Final    Comment: (NOTE) The Xpert Xpress SARS-CoV-2/FLU/RSV plus assay is intended as an aid in the diagnosis of influenza from Nasopharyngeal swab specimens and should not be used as a sole basis for treatment. Nasal washings and aspirates are unacceptable for Xpert Xpress SARS-CoV-2/FLU/RSV testing.  Fact Sheet for Patients: EntrepreneurPulse.com.au  Fact Sheet for Healthcare Providers: IncredibleEmployment.be  This test is not yet approved or cleared by the [place] FDA and has been authorized for detection and/or diagnosis of SARS-CoV-2 by FDA under an Emergency Use Authorization (EUA). This EUA will remain in effect (meaning this test can be used) for the duration of the COVID-19 declaration under Section 564(b)(1) of the Act, 21 U.S.C. section 360bbb-3(b)(1), unless the authorization is terminated or revoked.  Performed at [facility], [place] [address], [place], [place] [address]      Time coordinating discharge: 40 minutes  SIGNED:   Elmarie Shiley, MD  [facility]

## 2021-05-22 NOTE — Progress Notes (Addendum)
SATURATION QUALIFICATIONS: (This note is used to comply with regulatory documentation for home oxygen)  Patient Saturations on Room Air at Rest = 95%  Patient Saturations on Room Air while Ambulating = 92%  Patient Saturations 98 on 2 Liters of oxygen while Ambulating = 95%  Please briefly explain why patient needs home oxygen:

## 2021-05-22 NOTE — TOC Transition Note (Addendum)
Transition of Care [facility]) - CM/SW Discharge Note   Patient Details  Name: Mary Carson MRN: [id number] Date of Birth: 1946/01/29  Transition of Care [facility]) CM/SW Contact:  Zenon Mayo, RN Phone Number: 05/22/2021, 1:07 PM   Clinical Narrative:    Patient is for dc today, home with [facility] with [facility] [place],  NCM spoke with son, [name], he states they (family) will have to take turns to take care of patient.  He states he works and she will not have 24 hr care. He states that  he has no one to be with her for 24 hrs.  NCM informed him that this NCM spoke with patient and she has capacity and she states she is going home with [facility] and not to a facility, so we have to go by her wishes.  He said he understands, he states he will be here at 6 pm to transport her home. Zach with [employer] need new ambulatory sats for home oxygen.  NCM notified Staff RN. NCM spoke with son and informed him to go under Medicare.gov to get a list of private duty agencies and he can check on their prices.   Final next level of care: [place] Barriers to Discharge: No Barriers Identified   Patient Goals and CMS Choice Patient states their goals for this hospitalization and ongoing recovery are:: return home CMS Medicare.gov Compare Post Acute Care list provided to:: Patient Represenative (must comment) Choice offered to / list presented to : Adult Children  Discharge Placement                       Discharge Plan and Services   Discharge Planning Services: CM Consult Post Acute Care Choice: Home Health            DME Agency: NA       HH Arranged: RN, PT, Nurse's Aide [place] Agency: [place] Date [place]: 06/17/21 Time [place]: [place] Representative spoke with at [place]: [place] ([place]) Interventions     Readmission Risk Interventions No flowsheet data found.

## 2021-05-22 NOTE — Progress Notes (Signed)
Patient discharged, AVS printed instructions given. Home O2 provided. Patient left with family

## 2021-05-22 NOTE — Consult Note (Signed)
   [facility] Inpatient Consult   05/22/2021  Mary Carson 1946/07/03 [id number]  [name] [ACO] Patient: [name]  [name], is her new Primary Care Provider with [facility], this is not a [facility] provider. Patient's son, [name] confirms and  states she used to have [place], no longer her primary.    Thank you for this referral.  Unfortunately, our Care management team will not be able to assist you with this patient. The patient is not on the current member enrollment rosters for any of the [place] risk contracted plans.    Reason:   Current provider is in the [place] not in [employer].  For additional questions or referrals please contact:    Natividad Brood, RN BSN [facility] Liaison  [phone] business mobile phone Toll free office [phone]  Fax number: [phone] [email or web address]

## 2021-05-22 NOTE — Progress Notes (Signed)
Physical Therapy Treatment Patient Details Name: Mary Carson MRN: [id number] DOB: [birth year] Today's Date: 05/22/2021    History of Present Illness Pt is a 75 y.o. female admitted 05/14/21 with SOB and weakness; found to by hypoxic by [facility]. CXR with pleural effusion. Workup for CHF exacerbation, PNA, ongoing severe cachexia and unintentional weight loss. Pt also with R foot drop; MRI with small foraminal disc protrusions L3-5. PMH includes HTN, afib, [facility] Spotted fever, anxiety.   PT Comments    Pt progressing with mobility. Today's session focused on transfer and gait training, pt's stability improved with use of RW, which she plans to use at home; supervision for safety due to fall risk. Pt preparing for d/c home today; adamantly declines SNF-level therapies, but agreeable to HHPT services. Increased time educ re: activity recommendations, DME needs, fall risk reduction. Pt reports no further questions or concerns.  *Difficulty getting reliable pulse ox reading; session performed on RA SpO2 briefly reading 90% on RA immediately post-ambulation, unsure if accurate reading   Follow Up Recommendations  Home health PT;Supervision for mobility/OOB     Equipment Recommendations  None recommended by PT    Recommendations for Other Services       Precautions / Restrictions Precautions Precautions: Fall Restrictions Weight Bearing Restrictions: No    Mobility  Bed Mobility Overal bed mobility: Modified Independent Bed Mobility: Supine to Sit;Sit to Supine                Transfers Overall transfer level: Needs assistance Equipment used: Rolling walker (2 wheeled);None Transfers: Sit to/from Stand Sit to Stand: Supervision;Min guard         General transfer comment: Multiple sit<>stands from EOB and BSC with RW, supervision for safety; stand from recliner without DME, min guard for balance, pt reaching to arm rest for UE  support  Ambulation/Gait Ambulation/Gait assistance: Min guard;Supervision Gait Distance (Feet): 120 Feet Assistive device: Rolling walker (2 wheeled) Gait Pattern/deviations: Step-through pattern;Decreased stride length;Trunk flexed Gait velocity: Decreased   General Gait Details: Slow, fatigued gait with RW and supervision for safety, distance limited by c/o fatigue; pt walking very short distance in room without DME, min guard for balance and pt reaching to furniture for UE support   Stairs Stairs:  (pt declined stair training; educ on recommendation to have son guard her ascending 2 steps into home)           Wheelchair Mobility    Modified Rankin (Stroke Patients Only)       Balance Overall balance assessment: Needs assistance Sitting-balance support: Feet supported Sitting balance-Leahy Scale: Good Sitting balance - Comments: indep with pericare sitting on BSC   Standing balance support: Single extremity supported;Bilateral upper extremity supported;No upper extremity supported Standing balance-Leahy Scale: Fair Standing balance comment: can static stand without UE support; static and dynamic stability improved with RW                            Cognition Arousal/Alertness: Awake/alert Behavior During Therapy: WFL for tasks assessed/performed;Flat affect Overall Cognitive Status: No family/caregiver present to determine baseline cognitive functioning Area of Impairment: Safety/judgement;Awareness;Problem solving                         Safety/Judgement: Decreased awareness of deficits Awareness: Emergent Problem Solving: Requires verbal cues General Comments: Difficult to determine true cognitive impairment versus pt requiring repetition secondary to [facility]      Exercises  General Comments General comments (skin integrity, edema, etc.): Increased time discussing d/c recommendations (noted chart said son interested in pursuing SNF) -  pt adamantly declining SNF, agreeable to [place]; increased time educ re: fall risk reduction, DME needs, activity recommendations, assist for stairs and ADLs (sitting to bathe), etc. Difficulty getting reliable pulse ox read during session, 1x reading 90% on RA immediately post-ambulation; pt does not want to wear oxygen at home      Pertinent Vitals/Pain Pain Assessment: Faces Faces Pain Scale: Hurts a little bit Pain Location: Sacrum/buttocks sitting in recliner Pain Descriptors / Indicators: Discomfort;Guarding Pain Intervention(s): Monitored during session;Repositioned    Home Living                      Prior Function            PT Goals (current goals can now be found in the care plan section) Progress towards PT goals: Progressing toward goals    Frequency    Min 3X/week      PT Plan Current plan remains appropriate    Co-evaluation              AM-PAC PT "6 Clicks" Mobility   Outcome Measure  Help needed turning from your back to your side while in a flat bed without using bedrails?: None Help needed moving from lying on your back to sitting on the side of a flat bed without using bedrails?: A Little Help needed moving to and from a bed to a chair (including a wheelchair)?: A Little Help needed standing up from a chair using your arms (e.g., wheelchair or bedside chair)?: A Little Help needed to walk in hospital room?: A Little Help needed climbing 3-5 steps with a railing? : A Little 6 Click Score: 19    End of Session   Activity Tolerance: Patient tolerated treatment well;Patient limited by fatigue Patient left: in bed;with call bell/phone within reach;with bed alarm set;with nursing/sitter in room Nurse Communication: Mobility status (informed NT of difficulty getting pulse ox reading) PT Visit Diagnosis: Muscle weakness (generalized) (M62.81);Other abnormalities of gait and mobility (R26.89)     Time: [id number] PT Time Calculation  (min) (ACUTE ONLY): 25 min  Charges:  $Gait Training: 8-22 mins $Self Care/Home Management: [place], PT, DPT Acute Rehabilitation Services  Pager [phone] Office [place] 05/22/2021, 12:08 PM

## 2021-05-23 DIAGNOSIS — E440 Moderate protein-calorie malnutrition: Secondary | ICD-10-CM | POA: Diagnosis not present

## 2021-05-23 DIAGNOSIS — M21371 Foot drop, right foot: Secondary | ICD-10-CM | POA: Diagnosis not present

## 2021-05-23 DIAGNOSIS — J9601 Acute respiratory failure with hypoxia: Secondary | ICD-10-CM | POA: Diagnosis not present

## 2021-05-23 DIAGNOSIS — I480 Paroxysmal atrial fibrillation: Secondary | ICD-10-CM | POA: Diagnosis not present

## 2021-05-23 DIAGNOSIS — E876 Hypokalemia: Secondary | ICD-10-CM | POA: Diagnosis not present

## 2021-05-23 DIAGNOSIS — I10 Essential (primary) hypertension: Secondary | ICD-10-CM | POA: Diagnosis not present

## 2021-05-23 DIAGNOSIS — R69 Illness, unspecified: Secondary | ICD-10-CM | POA: Diagnosis not present

## 2021-05-23 DIAGNOSIS — L89151 Pressure ulcer of sacral region, stage 1: Secondary | ICD-10-CM | POA: Diagnosis not present

## 2021-05-23 DIAGNOSIS — I5033 Acute on chronic diastolic (congestive) heart failure: Secondary | ICD-10-CM | POA: Diagnosis not present

## 2021-05-24 DIAGNOSIS — R69 Illness, unspecified: Secondary | ICD-10-CM | POA: Diagnosis not present

## 2021-05-24 DIAGNOSIS — I10 Essential (primary) hypertension: Secondary | ICD-10-CM | POA: Diagnosis not present

## 2021-05-24 DIAGNOSIS — I5033 Acute on chronic diastolic (congestive) heart failure: Secondary | ICD-10-CM | POA: Diagnosis not present

## 2021-05-24 DIAGNOSIS — M21371 Foot drop, right foot: Secondary | ICD-10-CM | POA: Diagnosis not present

## 2021-05-24 DIAGNOSIS — I480 Paroxysmal atrial fibrillation: Secondary | ICD-10-CM | POA: Diagnosis not present

## 2021-05-24 DIAGNOSIS — L89151 Pressure ulcer of sacral region, stage 1: Secondary | ICD-10-CM | POA: Diagnosis not present

## 2021-05-24 DIAGNOSIS — E876 Hypokalemia: Secondary | ICD-10-CM | POA: Diagnosis not present

## 2021-05-24 DIAGNOSIS — E440 Moderate protein-calorie malnutrition: Secondary | ICD-10-CM | POA: Diagnosis not present

## 2021-05-24 DIAGNOSIS — J9601 Acute respiratory failure with hypoxia: Secondary | ICD-10-CM | POA: Diagnosis not present

## 2021-05-25 DIAGNOSIS — R69 Illness, unspecified: Secondary | ICD-10-CM | POA: Diagnosis not present

## 2021-05-25 DIAGNOSIS — I5033 Acute on chronic diastolic (congestive) heart failure: Secondary | ICD-10-CM | POA: Diagnosis not present

## 2021-05-25 DIAGNOSIS — I10 Essential (primary) hypertension: Secondary | ICD-10-CM | POA: Diagnosis not present

## 2021-05-25 DIAGNOSIS — E876 Hypokalemia: Secondary | ICD-10-CM | POA: Diagnosis not present

## 2021-05-25 DIAGNOSIS — J9601 Acute respiratory failure with hypoxia: Secondary | ICD-10-CM | POA: Diagnosis not present

## 2021-05-25 DIAGNOSIS — M21371 Foot drop, right foot: Secondary | ICD-10-CM | POA: Diagnosis not present

## 2021-05-25 DIAGNOSIS — L89151 Pressure ulcer of sacral region, stage 1: Secondary | ICD-10-CM | POA: Diagnosis not present

## 2021-05-25 DIAGNOSIS — I480 Paroxysmal atrial fibrillation: Secondary | ICD-10-CM | POA: Diagnosis not present

## 2021-05-25 DIAGNOSIS — E440 Moderate protein-calorie malnutrition: Secondary | ICD-10-CM | POA: Diagnosis not present

## 2021-05-28 DIAGNOSIS — E876 Hypokalemia: Secondary | ICD-10-CM | POA: Diagnosis not present

## 2021-05-28 DIAGNOSIS — I480 Paroxysmal atrial fibrillation: Secondary | ICD-10-CM | POA: Diagnosis not present

## 2021-05-28 DIAGNOSIS — M21371 Foot drop, right foot: Secondary | ICD-10-CM | POA: Diagnosis not present

## 2021-05-28 DIAGNOSIS — I10 Essential (primary) hypertension: Secondary | ICD-10-CM | POA: Diagnosis not present

## 2021-05-28 DIAGNOSIS — I5033 Acute on chronic diastolic (congestive) heart failure: Secondary | ICD-10-CM | POA: Diagnosis not present

## 2021-05-28 DIAGNOSIS — L89151 Pressure ulcer of sacral region, stage 1: Secondary | ICD-10-CM | POA: Diagnosis not present

## 2021-05-28 DIAGNOSIS — E440 Moderate protein-calorie malnutrition: Secondary | ICD-10-CM | POA: Diagnosis not present

## 2021-05-28 DIAGNOSIS — J9601 Acute respiratory failure with hypoxia: Secondary | ICD-10-CM | POA: Diagnosis not present

## 2021-05-28 DIAGNOSIS — R69 Illness, unspecified: Secondary | ICD-10-CM | POA: Diagnosis not present

## 2021-07-21 DEATH — deceased

## 2023-01-01 IMAGING — CT CT ABD-PELV W/ CM
2 of 5 series · 14 of 46 positions shown, 16 images · IV contrast (APPLIED)
Comparison: 07/07/2019

CLINICAL DATA: Loss of weight.

EXAM:
CT ABDOMEN AND PELVIS WITH CONTRAST
TECHNIQUE: Multidetector CT imaging of the abdomen and pelvis was performed
using the standard protocol following bolus administration of
intravenous contrast.
CONTRAST:  75mL OMNIPAQUE IOHEXOL 300 MG/ML  SOLN

[Series 3: abdomen 5.0 · axial · 0.73mm/px · z∈[+954,+1354]mm · 11 of 92 slices shown, 13 images]
[im 6/92  soft-tissue]
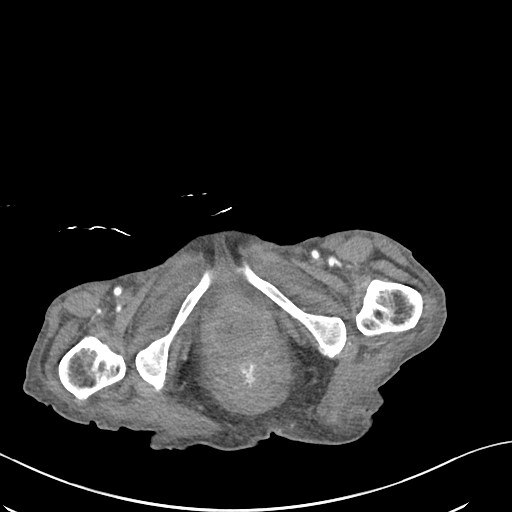
[im 6/92  bone]
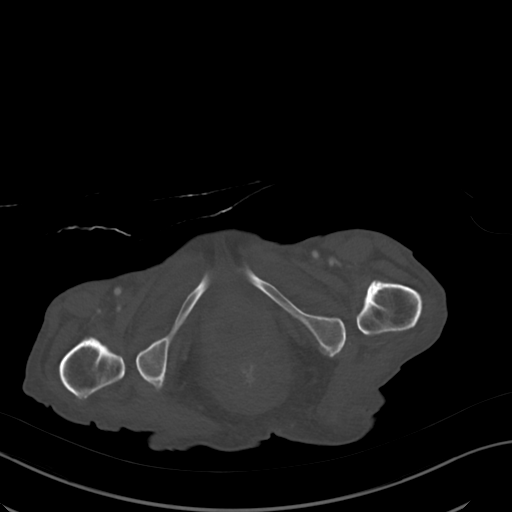
[im 18/92  soft-tissue]
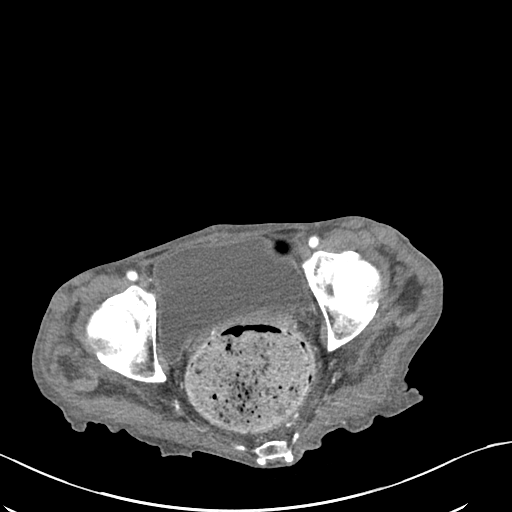
[im 23/92  soft-tissue]
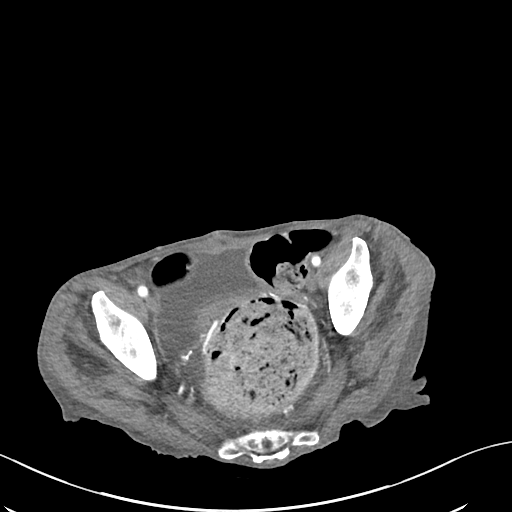
[im 29/92  soft-tissue]
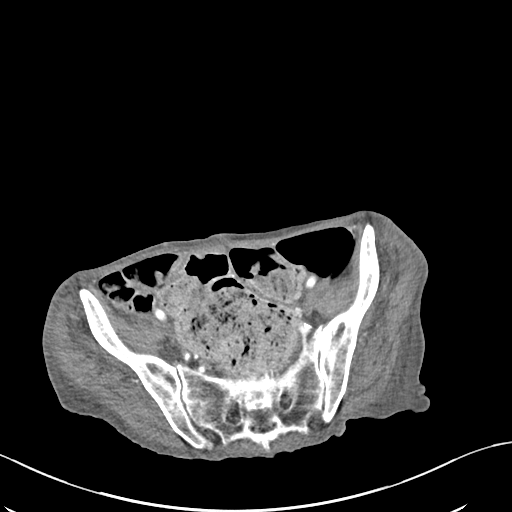
[im 40/92  soft-tissue]
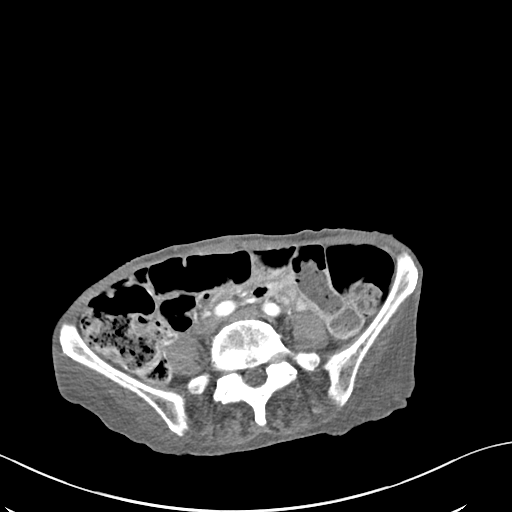
[im 46/92  soft-tissue]
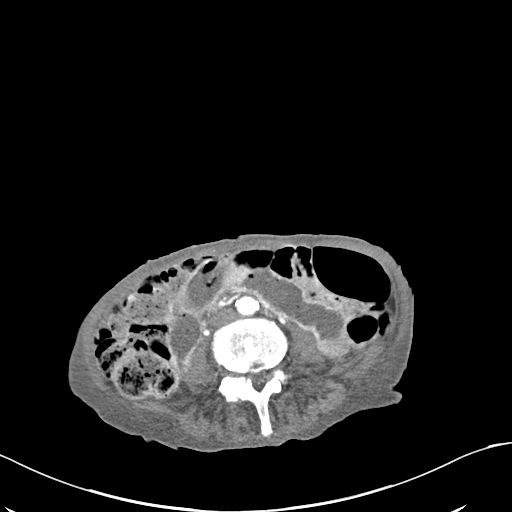
[im 52/92  soft-tissue]
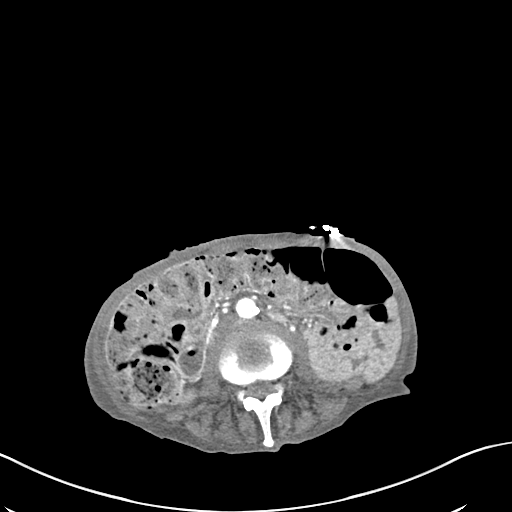
[im 63/92  soft-tissue]
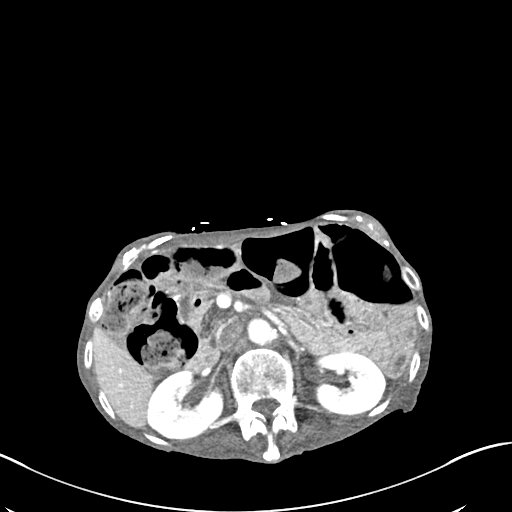
[im 69/92  soft-tissue]
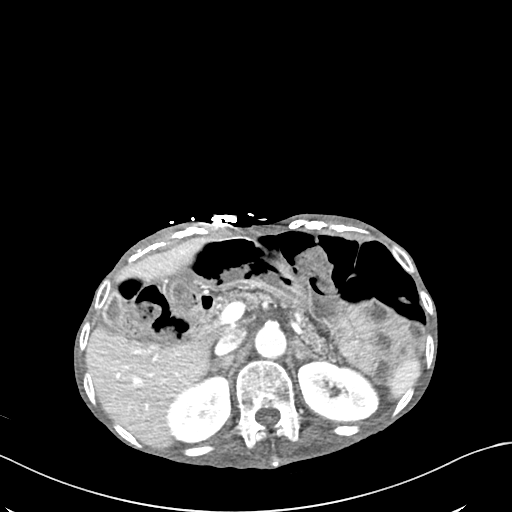
[im 69/92  bone]
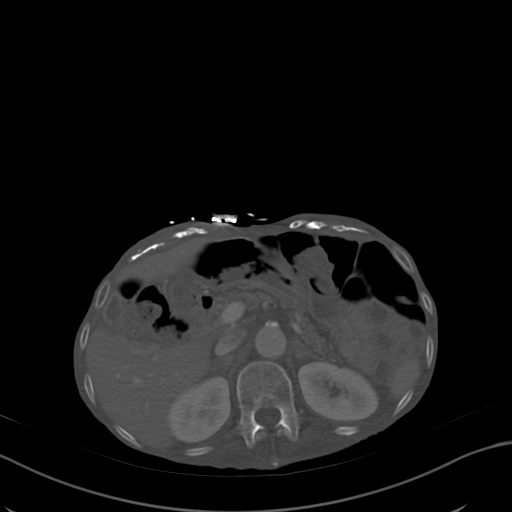
[im 74/92  soft-tissue]
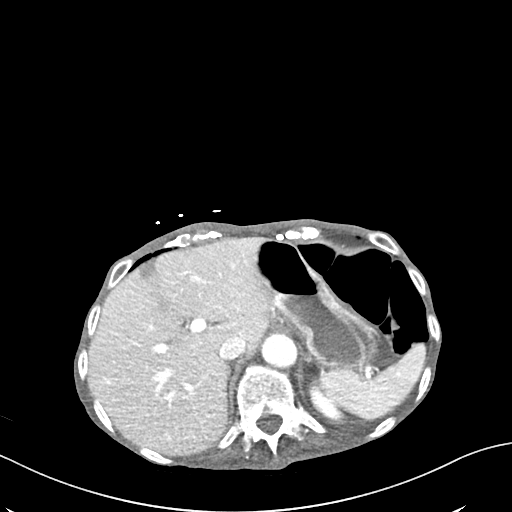
[im 86/92  soft-tissue]
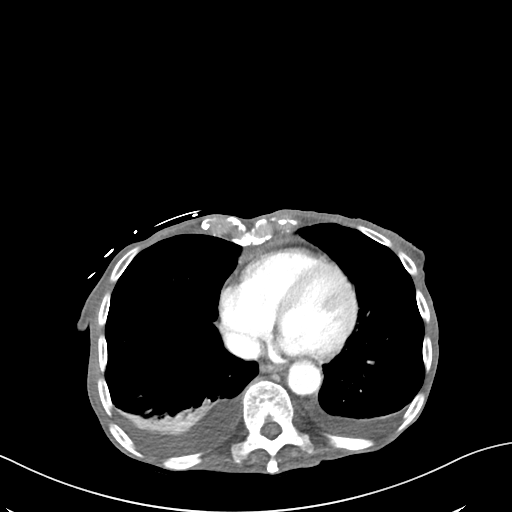

[Series 6: abdomen 3.0 mpr cor · coronal · 0.54mm/px · 3 of 67 slices shown]
[im 23/67  soft-tissue]
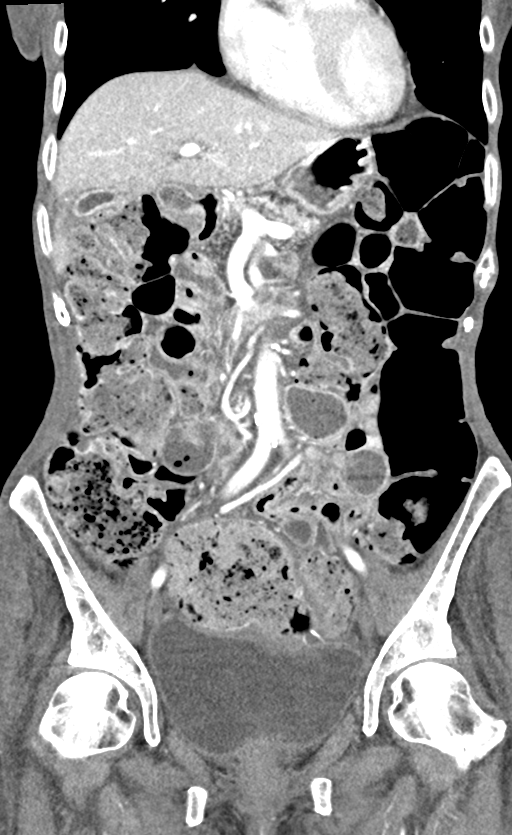
[im 30/67  soft-tissue]
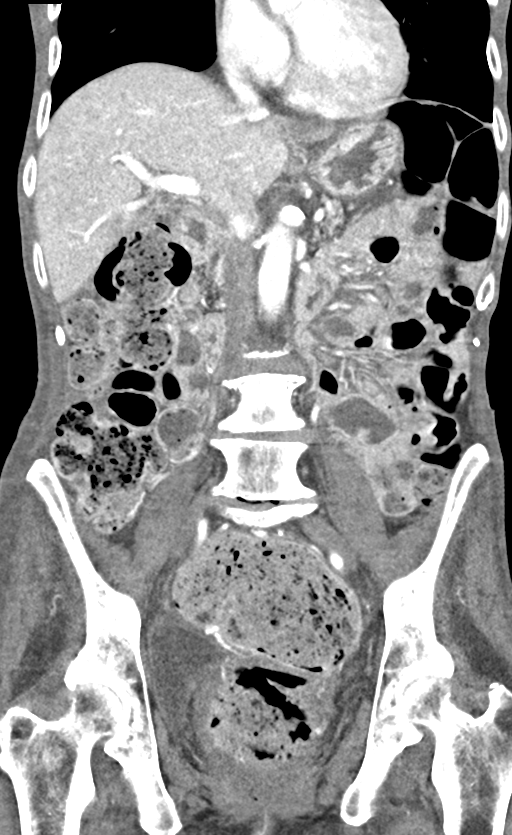
[im 37/67  soft-tissue]
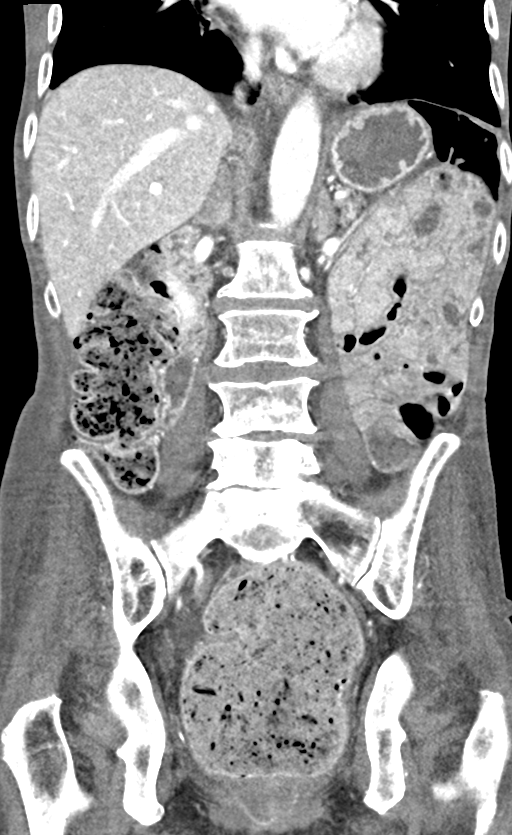

[14 of 46 positions shown; findings below may reference images not displayed]

FINDINGS: Lower chest: Small bilateral pleural effusion scratch set small
bilateral pleural effusions identified. Mild pneumonitis within the
right lower lobe with associated bronchial wall thickening
identified.

Hepatobiliary: Focal area of low attenuation adjacent to the
falciform ligament is similar to previous exam and is most
consistent with focal fatty deposition. Gallbladder appears
collapsed. No bile duct dilatation.

Pancreas: Unremarkable. No pancreatic ductal dilatation or
surrounding inflammatory changes.

Spleen: Normal in size without focal abnormality.

Adrenals/Urinary Tract: Normal appearance of the adrenal glands. No
suspicious mass, nephrolithiasis or hydronephrosis identified.

Stomach/Bowel: Stomach appears nondistended. Mild increase caliber
of the terminal ileum measuring up to 2.4 cm. No bowel wall
thickening or inflammation. The appendix is not confidently
identified separate from the right lower quadrant bowel loops. There
is a marked stool burden identified throughout the colon up to the
level of the rectum. Large volume of desiccated stool within the
rectum measures 8.3 x 9.7 by 13.8 cm (volume = 580 cm^3).

Vascular/Lymphatic: Aortic atherosclerosis. No aneurysm. No
abdominopelvic adenopathy identified.

Reproductive: Status post hysterectomy. No adnexal masses.

Other: No significant free fluid or fluid collections identified.
Patient appears diffusely cachectic.

Musculoskeletal: Degenerative disc disease is noted at L5-S1.
IMPRESSION: 1. No mass or adenopathy identified within the abdomen or pelvis.
2. Marked stool burden identified throughout the colon up to the
level of the rectum. Large volume of desiccated stool within the
rectum measures approximately 580 cc. Cannot exclude rectal
impaction. Correlate for any clinical signs or symptoms of
constipation.
3. Small bilateral pleural effusions. Mild pneumonitis within the
right lower lobe may represent infection or sequelae of aspiration
4. Aortic atherosclerosis.

Aortic Atherosclerosis ([id number]-[id number].I).
